# Patient Record
Sex: Male | Born: 1995 | Hispanic: Yes | Marital: Single | Smoking: Never smoker
Health system: Southern US, Community
[De-identification: ages and names within clinical notes are randomized; demographics above are authoritative.]

---
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
## 2014-05-09 NOTE — Op Note (Signed)
PATIENT NAME:  Gerald Reyes, Gerald Reyes MR#:  [id number] DATE OF BIRTH:  1995-07-25  DATE OF PROCEDURE:  01/04/2013  PREOPERATIVE DIAGNOSES: Preoperative diagnosis right knee anterior cruciate ligament tear, medial meniscus tear.   POSTOPERATIVE DIAGNOSIS: Preoperative diagnosis right knee anterior cruciate ligament tear, medial meniscus tear.   PROCEDURE PERFORMED:  1.  Arthroscopy, right knee.  2.  Arthroscopic-assisted anterior cruciate ligament reconstruction with hamstring autograft.  3.  Medial meniscus repair.   SURGEON: Murlean Hark, M.D.   ASSISTANT: Dedra Skeens.   ANESTHESIA: Femoral nerve block and general anesthesia.   ESTIMATED BLOOD LOSS: Minimal.   TOURNIQUET TIME: 23 minutes at 300 mmHg.   OPERATIVE FINDINGS: Complete tear, anterior cruciate ligament. Posterior horn, medial meniscus transverse tear with a some degeneration of torn portion.   IMPLANTS: Arthrex.  DRAINS: None.   COMPLICATIONS: None.   INDICATIONS FOR PROCEDURE: The patient is an 19 year old young man who had sustained an injury to the anterior cruciate ligament many years ago. After recurrent episodes of instability, most recently this past September, he decided to proceed with surgical intervention. Risks and benefits were explained to the patient and his father. Decision was made to use hamstring autograft.   DESCRIPTION OF PROCEDURE: The patient was identified in the preoperative holding area. Right lower extremity was marked as the operative site. The patient was brought into the Operating Room and placed on the table in supine position after femoral nerve block was administered in the preoperative holding area. General anesthesia was administered. Right lower extremity was prepared and draped in the usual sterile fashion. Surgical timeout was performed.   The leg was elevated for exsanguination, and the tourniquet was inflated to 300 mmHg. Tibial tubercle was palpated. A longitudinal incision was made  over the (Dictation Anomaly) <<MISSING TEXT>>. The semitendinosus and the gracilis tendons were identified. The sartorius was transected and tagged for future repair. After all adhesions were removed from both tendons, the semitendinosus was harvested. When cleaned of soft tissue and quadrupled, it measured approximately 10 mm. Graft was prepared on the back table under sterile conditions. The tourniquet was deflated at 21 minutes.   Lateral viewing portal was made. Arthroscope was inserted. Patellofemoral compartment was examined. Cartilage remains well preserved. Medial and lateral gutters were evaluated for loose bodies. None were located. Medial compartment was entered. The patient has a grade 1 to 2 chondral damage on the medial femoral condyle. Posterior horn of the meniscus demonstrated an unstable transverse tear. The torn portion was mildly friable. A meniscal rasp was inserted, and a bleeding edge was achieved on the meniscal rim. Two (Dictation Anomaly) <<MISSING TEXT>> curved needle delivery system tacks were inserted. Meniscal tear was found to be very stable.   Attention was turned to the lateral compartment. There was mild softening of the chondral surfaces without any distinctive lesions. Lateral meniscus was well preserved.   At this time, attention was turned to the notch. Notch was cleaned of residual anterior cruciate ligament stump. Lateral wall of the notch was carefully debrided of soft tissue. A very mild notchplasty was performed in order to allow for more space for implantation. Camera was moved to the medial portal, and the femoral retro drill guide was inserted. A lateral incision was made and sharp dissection was carried down to the tensor fascia lata. Blunt dissection was carried down to the lateral femoral cortex.   A 10 mm retro guide pin was inserted in appropriate position on the femoral lateral femoral notch. This was retrodrilled to approximately  32 mm, leaving a large  cortical a edge. Confirmation was made under direct visualization that no bony surfaces were breached. At this time, the FiberWire loop was passed through the femoral socket and into the joint. A tibial guide was now placed, in line with the anterior horn of the lateral meniscus and at the anterior edge of the posterior cruciate ligament. A 10 mm hole was drilled. Excess bone and soft tissue were debrided from the joint. The tibial tunnel was sequentially dilated to 10 mm. The FiberWire suture was passed through and the graft was slowly brought into the joint.   The tightrope button carefully flipped on the outer lateral cortex of the femur. Confirmation was done on fluoroscopy. The anterior cruciate ligament graft was hoisted into the joint, and care was taken to ensure that 25 mm of graft was nicely seated in the femoral socket.   Attention was now turned to the tibia. The tightrope 14 mm round button was attached and lowered down to the anterior tibial cortex. At least 20 mm of graft was nicely seated in the tibial tunnel. The graft was found to be nice and tight on probing. With the knee fully extended, there was no impingement of the graft on the notch. The femoral portion was terminally tightened, and tibial portion was terminally tightened.   At this time, secondary fixation was provided to the tibial side using a BioComposite SwiveLock. All wounds were copiously irrigated. The subcutaneous tissue was closed using 2-0 Vicryl suture. Skin was closed using nylon. Sterile dressings were applied. The patient was placed in a brace, locked in extension. He will follow up in my office in three days.      ____________________________ Murlean HarkShalini Tykwon Fera, MD sr:cg D: 01/04/2013 16:31:15 ET T: 01/05/2013 00:52:58 ET JOB#: [id number]  cc: Murlean HarkShalini Tahj Njoku, MD, <Dictator>

## 2014-05-10 NOTE — Op Note (Signed)
PATIENT NAME:  Gerald Reyes, Gerald Reyes MR#:  [id number] DATE OF BIRTH:  [birth year]  DATE OF PROCEDURE:  01/01/2013  PREOPERATIVE DIAGNOSES: Preoperative diagnosis right knee anterior cruciate ligament tear, medial meniscus tear.   POSTOPERATIVE DIAGNOSIS: Preoperative diagnosis right knee anterior cruciate ligament tear, medial meniscus tear.   PROCEDURE PERFORMED:  1.  Arthroscopy, right knee.  2.  Arthroscopic-assisted anterior cruciate ligament reconstruction with hamstring autograft.  3.  Medial meniscus repair.   SURGEON: Gerald Hark, M.D.   ASSISTANT: Dedra Skeens.   ANESTHESIA: Femoral nerve block and general anesthesia.   ESTIMATED BLOOD LOSS: Minimal.   TOURNIQUET TIME: 23 minutes at 300 mmHg.   OPERATIVE FINDINGS: Complete tear, anterior cruciate ligament. Posterior horn, medial meniscus transverse tear with a some degeneration of torn portion.   IMPLANTS: Arthrex.  DRAINS: None.   COMPLICATIONS: None.   INDICATIONS FOR PROCEDURE: The patient is an 19 year old young man who had sustained an injury to the anterior cruciate ligament many years ago. After recurrent episodes of instability, most recently this past September, he decided to proceed with surgical intervention. Risks and benefits were explained to the patient and his father. Decision was made to use hamstring autograft.   DESCRIPTION OF PROCEDURE: The patient was identified in the preoperative holding area. Right lower extremity was marked as the operative site. The patient was brought into the Operating Room and placed on the table in supine position after femoral nerve block was administered in the preoperative holding area. General anesthesia was administered. Right lower extremity was prepared and draped in the usual sterile fashion. Surgical timeout was performed.   The leg was elevated for exsanguination, and the tourniquet was inflated to 300 mmHg. Tibial tubercle was palpated. A longitudinal incision was made  over the antero-medial aspect of the proximal tibia. The semitendinosus and the gracilis tendons were identified. The sartorius was transected and tagged for future repair. After all adhesions were removed from both tendons, the semitendinosus was harvested. When cleaned of soft tissue and quadrupled, it measured approximately 10 mm. Graft was prepared on the back table under sterile conditions. The tourniquet was deflated at 21 minutes.   Lateral viewing portal was made. Arthroscope was inserted. Patellofemoral compartment was examined. Cartilage remains well preserved. Medial and lateral gutters were evaluated for loose bodies. None were located. Medial compartment was entered. The patient has a grade 1 to 2 chondral damage on the medial femoral condyle. Posterior horn of the meniscus demonstrated an unstable transverse tear. The torn portion was mildly friable. A meniscal rasp was inserted, and a bleeding edge was achieved on the meniscal rim. Two [employer] Fast Fix 360 curved needle delivery system tacks were inserted. Meniscal tear was found to be very stable.   Attention was turned to the lateral compartment. There was mild softening of the chondral surfaces without any distinctive lesions. Lateral meniscus was well preserved.   At this time, attention was turned to the notch. Notch was cleaned of residual anterior cruciate ligament stump. Lateral wall of the notch was carefully debrided of soft tissue. A very mild notchplasty was performed in order to allow for more space for implantation. Camera was moved to the medial portal, and the femoral retro drill guide was inserted. A lateral incision was made and sharp dissection was carried down to the tensor fascia lata. Blunt dissection was carried down to the lateral femoral cortex.   A 10 mm retro guide pin was inserted in appropriate position on the femoral lateral femoral notch. This  was retrodrilled to approximately 32 mm, leaving a large  cortical a edge. Confirmation was made under direct visualization that no bony surfaces were breached. At this time, the FiberWire loop was passed through the femoral socket and into the joint. A tibial guide was now placed, in line with the anterior horn of the lateral meniscus and at the anterior edge of the posterior cruciate ligament. A 10 mm hole was drilled. Excess bone and soft tissue were debrided from the joint. The tibial tunnel was sequentially dilated to 10 mm. The FiberWire suture was passed through and the graft was slowly brought into the joint.   The tightrope button carefully flipped on the outer lateral cortex of the femur. Confirmation was done on fluoroscopy. The anterior cruciate ligament graft was hoisted into the joint, and care was taken to ensure that 25 mm of graft was nicely seated in the femoral socket.   Attention was now turned to the tibia. The tightrope 14 mm round button was attached and lowered down to the anterior tibial cortex. At least 20 mm of graft was nicely seated in the tibial tunnel. The graft was found to be nice and tight on probing. With the knee fully extended, there was no impingement of the graft on the notch. The femoral portion was terminally tightened, and tibial portion was terminally tightened.   At this time, secondary fixation was provided to the tibial side using a BioComposite SwiveLock. All wounds were copiously irrigated. The subcutaneous tissue was closed using 2-0 Vicryl suture. Skin was closed using nylon. Sterile dressings were applied. The patient was placed in a brace, locked in extension. He will follow up in my office in three days.     ____________________________ Gerald HarkShalini Richard Ritchey, MD sr:cg D: 01/04/2013 16:31:00 ET T: 01/05/2013 00:52:58 ET JOB#: [id number]  cc: Gerald HarkShalini Milburn Freeney, MD, <Dictator> Gerald HarkSHALINI Kristopher Attwood MD ELECTRONICALLY SIGNED 02/07/2013 9:24

## 2017-01-11 ENCOUNTER — Emergency Department: Payer: Worker's Compensation

## 2017-01-11 ENCOUNTER — Encounter: Payer: Self-pay | Admitting: Physician Assistant

## 2017-01-11 ENCOUNTER — Emergency Department
Admission: EM | Admit: 2017-01-11 | Discharge: 2017-01-11 | Disposition: A | Discharge status: Home / Self Care | Payer: Worker's Compensation | Attending: Student in an Organized Health Care Education/Training Program | Admitting: Student in an Organized Health Care Education/Training Program

## 2017-01-11 ENCOUNTER — Other Ambulatory Visit: Payer: Self-pay

## 2017-01-11 DIAGNOSIS — S62616A Displaced fracture of proximal phalanx of right little finger, initial encounter for closed fracture: Secondary | ICD-10-CM | POA: Insufficient documentation

## 2017-01-11 DIAGNOSIS — Y9389 Activity, other specified: Secondary | ICD-10-CM | POA: Insufficient documentation

## 2017-01-11 DIAGNOSIS — S3991XA Unspecified injury of abdomen, initial encounter: Secondary | ICD-10-CM | POA: Diagnosis not present

## 2017-01-11 DIAGNOSIS — Y999 Unspecified external cause status: Secondary | ICD-10-CM | POA: Diagnosis not present

## 2017-01-11 DIAGNOSIS — Y92410 Unspecified street and highway as the place of occurrence of the external cause: Secondary | ICD-10-CM | POA: Diagnosis not present

## 2017-01-11 DIAGNOSIS — S6991XA Unspecified injury of right wrist, hand and finger(s), initial encounter: Secondary | ICD-10-CM | POA: Diagnosis present

## 2017-01-11 MED ORDER — IBUPROFEN 800 MG PO TABS: 800.0000 mg | ORAL_TABLET | Freq: Three times a day (TID) | ORAL | 0 refills | Status: DC | PRN

## 2017-01-11 MED ORDER — IOPAMIDOL (ISOVUE-370) INJECTION 76%
85.0000 mL | Freq: Once | INTRAVENOUS | Status: AC | PRN
  Administered 2017-01-11: 85 mL via INTRAVENOUS
  Filled 2017-01-11: qty 100

## 2017-01-11 NOTE — Discharge Instructions (Signed)
Follow up with Dr. Allena KatzPatel for your broken finger, call for an appointment, elevate and ice the hand, continue to wear the splint, if your abdominal pain is worsening please return to the emergency department, the CT showed that you have a contusion to the abdomen, this is not life-threatening, take the ibuprofen as needed for pain, you will hurt worse in the next few days this is normal, return to the emergency department if you have any other complaints
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
## 2020-03-05 ENCOUNTER — Ambulatory Visit: Payer: Managed Care, Other (non HMO) | Admitting: Adult Health

## 2020-03-05 ENCOUNTER — Other Ambulatory Visit: Payer: Self-pay

## 2020-03-05 ENCOUNTER — Encounter: Payer: Self-pay | Admitting: Adult Health

## 2020-03-05 VITALS — BP 120/73 | HR 63 | Temp 98.1°F | Resp 16 | Ht 71.0 in | Wt 273.6 lb

## 2020-03-05 DIAGNOSIS — Z1389 Encounter for screening for other disorder: Secondary | ICD-10-CM

## 2020-03-05 DIAGNOSIS — Z113 Encounter for screening for infections with a predominantly sexual mode of transmission: Secondary | ICD-10-CM | POA: Diagnosis not present

## 2020-03-05 DIAGNOSIS — Z0000 Encounter for general adult medical examination without abnormal findings: Secondary | ICD-10-CM | POA: Diagnosis not present

## 2020-03-05 DIAGNOSIS — Z6838 Body mass index (BMI) 38.0-38.9, adult: Secondary | ICD-10-CM | POA: Insufficient documentation

## 2020-03-05 DIAGNOSIS — S76312A Strain of muscle, fascia and tendon of the posterior muscle group at thigh level, left thigh, initial encounter: Secondary | ICD-10-CM | POA: Insufficient documentation

## 2020-03-05 DIAGNOSIS — M62838 Other muscle spasm: Secondary | ICD-10-CM | POA: Insufficient documentation

## 2020-03-05 DIAGNOSIS — E559 Vitamin D deficiency, unspecified: Secondary | ICD-10-CM | POA: Insufficient documentation

## 2020-03-05 LAB — POCT URINALYSIS DIPSTICK
Bilirubin, UA: NEGATIVE
Blood, UA: NEGATIVE
Glucose, UA: NEGATIVE
Ketones, UA: NEGATIVE
Leukocytes, UA: NEGATIVE
Nitrite, UA: NEGATIVE
Protein, UA: NEGATIVE
Spec Grav, UA: 1.01 (ref 1.010–1.025)
Urobilinogen, UA: 0.2 E.U./dL
pH, UA: 6.5 (ref 5.0–8.0)

## 2020-03-05 MED ORDER — IBUPROFEN 600 MG PO TABS: 600.0000 mg | ORAL_TABLET | Freq: Three times a day (TID) | ORAL | 0 refills | Status: DC | PRN

## 2020-03-05 MED ORDER — BACLOFEN 10 MG PO TABS: 10.0000 mg | ORAL_TABLET | Freq: Every evening | ORAL | 0 refills | Status: DC | PRN

## 2020-03-05 NOTE — Progress Notes (Signed)
New patient visit   Patient: Gerald Reyes   DOB: [birth year]   24 y.o. Male  MRN: [id number] Visit Date: 03/05/2020  Today's healthcare provider: Jairo Ben, FNP   Chief Complaint  Patient presents with  . New Patient (Initial Visit)   Subjective    Gerald Reyes is a 25 y.o. male who presents today as a new patient to establish care.  HPI  Patient presents in office to establish care he states that he feels well today and has no concerns to address. Patient reports that he follows a general diet, he is staying active by exercising weekly and states that his sleep patterns are fair sleeping on average 5 hrs a night.    He has some neck pain, after being on the computer all day. Muscles are tight after work. Has tried some stretching exercises which help some. He denies any  known injury to spine.   He plays soccer, left leg, he has pulled left hamstring, he says it has improved with stretching and he has been icing, this occurred around two months ago. deneis any increasing pain, or gait problems and feels he has been doing well reports symptoms as mild.   He reports he is drinking alcohol on occasion like twice a month socially, he has [employer]  He reports he feels down a few days with work, he has been working at home for two years. Denies any suicidal or homicidal thoughts or intents.   He feels well he says mentally, just working at home gets boring he has been home since the Covid pandemic.    Patient  denies any fever, body aches,chills, rash, chest pain, shortness of breath, nausea, vomiting, or diarrhea.  He has no other concerns at this time reported.   Denies dizziness, lightheadedness, pre syncopal or syncopal episodes.    History reviewed. No pertinent past medical history. Past Surgical History:  Procedure Laterality Date  . ANTERIOR CRUCIATE LIGAMENT REPAIR  12/2013  . MENISCUS REPAIR  12/2013   No family status information on file.   History  reviewed. No pertinent family history. Social History   Socioeconomic History  . Marital status: Single    Spouse name: Not on file  . Number of children: Not on file  . Years of education: Not on file  . Highest education level: Not on file  Occupational History  . Not on file  Tobacco Use  . Smoking status: Never Smoker  . Smokeless tobacco: Never Used  Substance and Sexual Activity  . Alcohol use: Not Currently  . Drug use: Never  . Sexual activity: Not on file  Other Topics Concern  . Not on file  Social History Narrative  . Not on file   Social Determinants of Health   Financial Resource Strain: Not on file  Food Insecurity: Not on file  Transportation Needs: Not on file  Physical Activity: Not on file  Stress: Not on file  Social Connections: Not on file   Outpatient Medications Prior to Visit  Medication Sig  . [DISCONTINUED] ibuprofen (ADVIL,MOTRIN) 800 MG tablet Take 1 tablet (800 mg total) by mouth every 8 (eight) hours as needed.   No facility-administered medications prior to visit.   No Known Allergies   There is no immunization history on file for this patient.  Health Maintenance  Topic Date Due  . Hepatitis C Screening  Never done  . COVID-19 Vaccine (1) Never done  . HIV Screening  Never done  .  INFLUENZA VACCINE  04/16/2020 (Originally 08/18/2019)  . TETANUS/TDAP  03/05/2021 (Originally 08/10/2013)    Patient Care Team: Charley Lafrance, Eula FriedMichelle S, FNP as PCP - General (Family Medicine)  Review of Systems  Constitutional: Negative.   HENT: Negative.   Respiratory: Negative.   Cardiovascular: Negative.   Gastrointestinal: Negative.   Genitourinary: Negative.   Musculoskeletal: Negative.   Skin: Negative.   Neurological: Negative.   Psychiatric/Behavioral: Negative.     Last CBC No results found for: WBC, HGB, HCT, MCV, MCH, RDW, PLT Last metabolic panel Lab Results  Component Value Date   GLUCOSE 83 10/12/2012   NA 139 10/12/2012   K  4.5 10/12/2012   CL 104 10/12/2012   CO2 32 (H) 10/12/2012   BUN 18 10/12/2012   CREATININE 0.97 10/12/2012   GFRNONAA >60 10/12/2012   GFRAA >60 10/12/2012   CALCIUM 9.7 10/12/2012   PROT 8.2 10/12/2012   ALBUMIN 4.5 10/12/2012   BILITOT 0.6 10/12/2012   ALKPHOS 94 (L) 10/12/2012   AST 20 10/12/2012   ALT 24 10/12/2012   ANIONGAP 3 (L) 10/12/2012   Last lipids Lab Results  Component Value Date   CHOL 159 10/12/2012   HDL 42 10/12/2012   LDLCALC 85 10/12/2012   TRIG 158 (H) 10/12/2012   Last hemoglobin A1c Lab Results  Component Value Date   HGBA1C 5.2 10/12/2012   Last thyroid functions Lab Results  Component Value Date   TSH 1.81 10/12/2012   Last vitamin D No results found for: 25OHVITD2, 25OHVITD3, VD25OH Last vitamin B12 and Folate No results found for: VITAMINB12, FOLATE    Objective    BP 120/73   Pulse 63   Temp 98.1 F (36.7 C) (Oral)   Resp 16   Ht 5\' 11"  (1.803 m)   Wt 273 lb 9.6 oz (124.1 kg)   SpO2 99%   BMI 38.16 kg/m  Physical Exam Vitals and nursing note reviewed.  Constitutional:      General: He is active. He is not in acute distress.    Appearance: Normal appearance. He is well-developed and well-nourished. He is not ill-appearing, toxic-appearing, sickly-appearing or diaphoretic.     Comments: Patient is alert and oriented and responsive to questions Engages in eye contact with provider. Speaks in full sentences without any pauses without any shortness of breath or distress.    HENT:     Head: Normocephalic and atraumatic.     Right Ear: Hearing, tympanic membrane, ear canal and external ear normal.     Left Ear: Hearing, tympanic membrane, ear canal and external ear normal.     Nose: Nose normal. No congestion or rhinorrhea.     Mouth/Throat:     Mouth: Oropharynx is clear and moist and mucous membranes are normal. Mucous membranes are moist.     Pharynx: Uvula midline. No oropharyngeal exudate or posterior oropharyngeal erythema.   Eyes:     General: Lids are normal. No scleral icterus.       Right eye: No discharge.        Left eye: No discharge.     Extraocular Movements: EOM normal.     Conjunctiva/sclera: Conjunctivae normal.     Pupils: Pupils are equal, round, and reactive to light.  Neck:     Thyroid: No thyromegaly.     Vascular: Normal carotid pulses. No carotid bruit, hepatojugular reflux or JVD.     Trachea: Trachea and phonation normal. No tracheal tenderness or tracheal deviation.     Meningeal: Brudzinski's  sign absent.  Cardiovascular:     Rate and Rhythm: Normal rate and regular rhythm.     Pulses: Normal pulses and intact distal pulses.     Heart sounds: Normal heart sounds, S1 normal and S2 normal. Heart sounds not distant. No murmur heard. No friction rub. No gallop.   Pulmonary:     Effort: Pulmonary effort is normal. No accessory muscle usage or respiratory distress.     Breath sounds: Normal breath sounds. No stridor. No wheezing, rhonchi or rales.  Chest:     Chest wall: No tenderness.  Abdominal:     General: Bowel sounds are normal. There is no distension. Aorta is normal.     Palpations: Abdomen is soft. There is no mass.     Tenderness: There is no abdominal tenderness. There is no right CVA tenderness, left CVA tenderness, guarding or rebound.     Hernia: No hernia is present.  Musculoskeletal:        General: No deformity or edema. Normal range of motion.     Right shoulder: Normal.     Left shoulder: Normal.     Cervical back: Full passive range of motion without pain, normal range of motion and neck supple. Spasms (see diagram ) and tenderness (see diagram ) present. No swelling, edema, deformity, erythema, signs of trauma, lacerations, rigidity, torticollis, bony tenderness or crepitus. No pain with movement. Normal range of motion.     Thoracic back: Normal.     Lumbar back: Normal.       Back:     Right hip: Normal.     Left hip: Normal.     Right upper leg: Normal.      Left upper leg: Normal.     Right knee: Normal.     Left knee: Normal.     Comments: Patient moves on and off of exam table and in room without difficulty. Gait is normal in hall and in room. Patient is oriented to person place time and situation. Patient answers questions appropriately and engages in conversation.   Lymphadenopathy:     Head:     Right side of head: No submental, submandibular, tonsillar, preauricular, posterior auricular or occipital adenopathy.     Left side of head: No submental, submandibular, tonsillar, preauricular, posterior auricular or occipital adenopathy.     Cervical: No cervical adenopathy.     Upper Body:  No axillary adenopathy present. Skin:    General: Skin is warm, dry and intact.     Capillary Refill: Capillary refill takes less than 2 seconds.     Coloration: Skin is not pale.     Findings: No erythema or rash.     Nails: There is no clubbing or cyanosis.  Neurological:     Mental Status: He is alert and oriented to person, place, and time.     GCS: GCS eye subscore is 4. GCS verbal subscore is 5. GCS motor subscore is 6.     Cranial Nerves: No cranial nerve deficit.     Sensory: No sensory deficit.     Motor: No weakness or abnormal muscle tone.     Coordination: He displays a negative Romberg sign. Coordination normal.     Gait: Gait normal.     Deep Tendon Reflexes: Strength normal and reflexes are normal and symmetric. Reflexes normal.  Psychiatric:        Mood and Affect: Mood and affect and mood normal.        Speech: Speech  normal.        Behavior: Behavior normal.        Thought Content: Thought content normal.        Cognition and Memory: Cognition and memory normal.        Judgment: Judgment normal.     Depression Screen PHQ 2/9 Scores 03/05/2020  PHQ - 2 Score 2  PHQ- 9 Score 5   Results for orders placed or performed in visit on 03/05/20  POCT urinalysis dipstick  Result Value Ref Range   Color, UA yellow    Clarity, UA  clear    Glucose, UA Negative Negative   Bilirubin, UA negative    Ketones, UA negative    Spec Grav, UA 1.010 1.010 - 1.025   Blood, UA negative    pH, UA 6.5 5.0 - 8.0   Protein, UA Negative Negative   Urobilinogen, UA 0.2 0.2 or 1.0 E.U./dL   Nitrite, UA negative    Leukocytes, UA Negative Negative   Appearance     Odor      Assessment & Plan      1. Routine health maintenance Orders Placed This Encounter  Procedures  . CBC with Differential/Platelet  . Comprehensive metabolic panel  . Lipid panel  . TSH  . VITAMIN D 25 Hydroxy (Vit-D Deficiency, Fractures)  . HIV Antibody (routine testing w rflx)  . Hepatitis C Antibody  . RPR  . POCT urinalysis dipstick   The patient is advised to begin progressive daily aerobic exercise program, follow a low fat, low cholesterol diet, attempt to lose weight, decrease or avoid alcohol intake and reduce salt in diet and cooking.  Dietary lifestyle changes discussed.    2. Screening for blood or protein in urine  - POCT urinalysis dipstick  3. Vitamin D insufficiency  - VITAMIN D 25 Hydroxy (Vit-D Deficiency, Fractures)  4. Screening for STD (sexually transmitted disease) Declined any other STD testing. Denies any symptoms or known exposures.  - HIV Antibody (routine testing w rflx) - Hepatitis C Antibody - RPR  5. Muscle spasms of neck  - CBC with Differential/Platelet - Comprehensive metabolic panel - ibuprofen (ADVIL) 600 MG tablet; Take 1 tablet (600 mg total) by mouth every 8 (eight) hours as needed for moderate pain.  Dispense: 30 tablet; Refill: 0 - baclofen (LIORESAL) 10 MG tablet; Take 1 tablet (10 mg total) by mouth at bedtime as needed for muscle spasms.  Dispense: 30 each; Refill: 0  6. Body mass index (BMI) of 38.0-38.9 in adult See # 1 diagnosis for plan of care/ weight loss advised.   7. History of left hamstring strain  Was playing soccer, seems to be healing well, symptoms have improved per patient.   Stretches given in After visit summary. Follow up at anytime if worsening. Exam is normal.    Red Flags discussed. The patient was given clear instructions to go to ER or return to medical center if any red flags develop, symptoms do not improve, worsen or new problems develop. They verbalized understanding.  Return in about 3 months (around 06/02/2020), or if symptoms worsen or fail to improve, for at any time for any worsening symptoms, Go to Emergency room/ urgent care if worse.  The entirety of the information documented in the History of Present Illness, Review of Systems and Physical Exam were personally obtained by me. Portions of this information were initially documented by the CMA and reviewed by me for thoroughness and accuracy.    Beverely Pace  Azarel Banner, [place] [phone] (phone) [phone] (fax)  [facility]

## 2020-03-05 NOTE — Progress Notes (Signed)
Urine within normal limits.

## 2020-03-05 NOTE — Patient Instructions (Addendum)
Cervical Strain and Sprain Rehab Ask your health care provider which exercises are safe for you. Do exercises exactly as told by your health care provider and adjust them as directed. It is normal to feel mild stretching, pulling, tightness, or discomfort as you do these exercises. Stop right away if you feel sudden pain or your pain gets worse. Do not begin these exercises until told by your health care provider. Stretching and range-of-motion exercises Cervical side bending 1. Using good posture, sit on a stable chair or stand up. 2. Without moving your shoulders, slowly tilt your left / right ear to your shoulder until you feel a stretch in the opposite side neck muscles. You should be looking straight ahead. 3. Hold for __________ seconds. 4. Repeat with the other side of your neck. Repeat __________ times. Complete this exercise __________ times a day.   Cervical rotation 1. Using good posture, sit on a stable chair or stand up. 2. Slowly turn your head to the side as if you are looking over your left / right shoulder. ? Keep your eyes level with the ground. ? Stop when you feel a stretch along the side and the back of your neck. 3. Hold for __________ seconds. 4. Repeat this by turning to your other side. Repeat __________ times. Complete this exercise __________ times a day.   Thoracic extension and pectoral stretch 1. Roll a towel or a small blanket so it is about 4 inches (10 cm) in diameter. 2. Lie down on your back on a firm surface. 3. Put the towel lengthwise, under your spine in the middle of your back. It should not be under your shoulder blades. The towel should line up with your spine from your middle back to your lower back. 4. Put your hands behind your head and let your elbows fall out to your sides. 5. Hold for __________ seconds. Repeat __________ times. Complete this exercise __________ times a day. Strengthening exercises Isometric upper cervical  flexion 1. Lie on your back with a thin pillow behind your head and a small rolled-up towel under your neck. 2. Gently tuck your chin toward your chest and nod your head down to look toward your feet. Do not lift your head off the pillow. 3. Hold for __________ seconds. 4. Release the tension slowly. Relax your neck muscles completely before you repeat this exercise. Repeat __________ times. Complete this exercise __________ times a day. Isometric cervical extension 1. Stand about 6 inches (15 cm) away from a wall, with your back facing the wall. 2. Place a soft object, about 6-8 inches (15-20 cm) in diameter, between the back of your head and the wall. A soft object could be a small pillow, a ball, or a folded towel. 3. Gently tilt your head back and press into the soft object. Keep your jaw and forehead relaxed. 4. Hold for __________ seconds. 5. Release the tension slowly. Relax your neck muscles completely before you repeat this exercise. Repeat __________ times. Complete this exercise __________ times a day.   Posture and body mechanics Body mechanics refers to the movements and positions of your body while you do your daily activities. Posture is part of body mechanics. Good posture and healthy body mechanics can help to relieve stress in your body's tissues and joints. Good posture means that your spine is in its natural S-curve position (your spine is neutral), your shoulders are pulled back slightly, and your head is not tipped forward. The  following are general guidelines for applying improved posture and body mechanics to your everyday activities. Sitting 1. When sitting, keep your spine neutral and keep your feet flat on the floor. Use a footrest, if necessary, and keep your thighs parallel to the floor. Avoid rounding your shoulders, and avoid tilting your head forward. 2. When working at a desk or a computer, keep your desk at a height where your hands are slightly lower than your  elbows. Slide your chair under your desk so you are close enough to maintain good posture. 3. When working at a computer, place your monitor at a height where you are looking straight ahead and you do not have to tilt your head forward or downward to look at the screen.   Standing  When standing, keep your spine neutral and keep your feet about hip-width apart. Keep a slight bend in your knees. Your ears, shoulders, and hips should line up.  When you do a task in which you stand in one place for a long time, place one foot up on a stable object that is 2-4 inches (5-10 cm) high, such as a footstool. This helps keep your spine neutral.   Resting When lying down and resting, avoid positions that are most painful for you. Try to support your neck in a neutral position. You can use a contour pillow or a small rolled-up towel. Your pillow should support your neck but not push on it. This information is not intended to replace advice given to you by your health care provider. Make sure you discuss any questions you have with your health care provider. Document Revised: 04/25/2018 Document Reviewed: 10/04/2017 Elsevier Patient Education  2021 [employer]  Muscle Cramps and Spasms Muscle cramps and spasms are when muscles tighten by themselves. They usually get better within minutes. Muscle cramps are painful. They are usually stronger and last longer than muscle spasms. Muscle spasms may or may not be painful. They can last a few seconds or much longer. Cramps and spasms can affect any muscle, but they occur most often in the calf muscles of the leg. They are usually not caused by a serious problem. In many cases, the cause is not known. Some common causes include:  Doing more physical work or exercise than your body is ready for.  Using the muscles too much (overuse) by repeating certain movements too many times.  Staying in a certain position for a long time.  Playing a sport or doing an  activity without preparing properly.  Using bad form or technique while playing a sport or doing an activity.  Not having enough water in your body (dehydration).  Injury.  Side effects of some medicines.  Low levels of the salts and minerals in your blood (electrolytes), such as low potassium or calcium. Follow these instructions at home: Managing pain and stiffness  Massage, stretch, and relax the muscle. Do this for many minutes at a time.  If told, put heat on tight or tense muscles as often as told by your doctor. Use the heat source that your doctor recommends, such as a moist heat pack or a heating pad. ? Place a towel between your skin and the heat source. ? Leave the heat on for 20-30 minutes. ? Remove the heat if your skin turns bright red. This is very important if you are not able to feel pain, heat, or cold. You may have a greater risk of getting burned.  If told, put ice on  the affected area. This may help if you are sore or have pain after a cramp or spasm. ? Put ice in a plastic bag. ? Place a towel between your skin and the bag. ? Leave the ice on for 20 minutes, 2-3 times a day.  Try taking hot showers or baths to help relax tight muscles.      Eating and drinking  Drink enough fluid to keep your pee (urine) pale yellow.  Eat a healthy diet to help ensure that your muscles work well. This should include: ? Fruits and vegetables. ? Lean protein. ? Whole grains. ? Low-fat or nonfat dairy products. General instructions  If you are having cramps often, avoid intense exercise for several days.  Take over-the-counter and prescription medicines only as told by your doctor.  Watch for any changes in your symptoms.  Keep all follow-up visits as told by your doctor. This is important. Contact a doctor if:  Your cramps or spasms get worse or happen more often.  Your cramps or spasms do not get better with time. Summary  Muscle cramps and spasms are when  muscles tighten by themselves. They usually get better within minutes.  Cramps and spasms occur most often in the calf muscles of the leg.  Massage, stretch, and relax the muscle. This may help the cramp or spasm go away.  Drink enough fluid to keep your pee (urine) pale yellow. This information is not intended to replace advice given to you by your health care provider. Make sure you discuss any questions you have with your health care provider. Document Revised: 05/29/2017 Document Reviewed: 05/29/2017 Elsevier Patient Education  2021 [employer]  Hamstring Strain  A hamstring strain happens when the muscles in the back of the thighs (hamstring muscles) are overstretched or torn. The hamstring muscles are used in straightening the hips, bending the knees, and pulling back the legs. This injury is often called a pulled hamstring muscle. The tissue that connects the muscle to a bone (tendon) may also be affected. The severity of a hamstring strain may be rated in degrees or grades. First-degree (or grade 1) strains have the least amount of muscle tearing and pain. Second-degree and third-degree (grade 2 and 3) strains have increasingly more tearing and pain. What are the causes? This condition is caused by a sudden, violent force being placed on the hamstring muscles, stretching them too far. This often happens during activities that involve running, jumping, kicking, or weight lifting. What increases the risk? Hamstring strains are especially common in athletes. The following factors may also make you more likely to develop this condition:  Having low strength, endurance, or flexibility of the hamstring muscles.  Doing high-impact physical activity or sports.  Having poor physical fitness.  Having a previous leg injury.  Having tired (fatigued) muscles. What are the signs or symptoms? Symptoms of this condition include:  Pain in the back of the  thigh.  Swelling.  Bruising.  Muscle spasms.  Trouble moving the affected muscle because of pain. For severe strains, you may feel popping or snapping in the back of your thigh when the injury occurs. How is this diagnosed? This condition is diagnosed based on your symptoms, your medical history, and a physical exam. How is this treated? Treatment for this condition usually involves:  Protecting, resting, icing, applying compression, and elevating the injured area (PRICE therapy).  Medicines. Your health care provider may recommend medicines to help reduce pain or inflammation.  Doing exercises to  regain strength and flexibility in the muscles. Your health care provider will tell you when it is okay to begin exercising. Follow these instructions at home: PRICE therapy Use PRICE therapy to promote muscle healing during the first 2-3 days after your injury, or as told by your health care provider.  Protect the muscle from being injured again.  Rest your injury. This usually involves limiting your normal activities and not using the injured hamstring muscle. Talk with your health care provider about how you should limit your activities.  Apply ice to the injured area: ? Put ice in a plastic bag. ? Place a towel between your skin and the bag. ? Leave the ice on for 20 minutes, 2-3 times a day. After the third day, switch to applying heat as told.  Put pressure (compression) on your injured hamstring by wrapping it with an elastic bandage. Be careful not to wrap it too tightly. That may interfere with blood circulation or may increase swelling.  Raise (elevate) your injured hamstring above the level of your heart as often as possible. When you are lying down, you can do this by putting a pillow under your thigh.   Activity  Begin exercising or stretching only as told by your health care provider.  Do not return to full activity level until your health care provider approves.  To  help prevent muscle strains in the future, always warm up before exercising and stretch afterward. General instructions  Take over-the-counter and prescription medicines only as told by your health care provider.  If directed, apply heat to the affected area as often as told by your health care provider. Use the heat source that your health care provider recommends, such as a moist heat pack or a heating pad. ? Place a towel between your skin and the heat source. ? Leave the heat on for 20-30 minutes. ? Remove the heat if your skin turns bright red. This is especially important if you are unable to feel pain, heat, or cold. You may have a greater risk of getting burned.  Keep all follow-up visits as told by your health care provider. This is important. Contact a health care provider if you have:  Increasing pain or swelling in the injured area.  Numbness, tingling, or a significant loss of strength in the injured area. Get help right away if:  Your foot or your toes become cold or turn blue. Summary  A hamstring strain happens when the muscles in the back of the thighs (hamstring muscles) are overstretched or torn.  This injury can be caused by a sudden, violent force being placed on the hamstring muscles, causing them to stretch too far.  Symptoms include pain, swelling, and muscle spasms in the injured area.  Treatment includes what is called PRICE therapy: protecting, resting, icing, applying compression, and elevating the injured area. This information is not intended to replace advice given to you by your health care provider. Make sure you discuss any questions you have with your health care provider. Document Revised: 09/06/2019 Document Reviewed: 09/06/2019 Elsevier Patient Education  2021 [employer] Inc[employer] Health Maintenance, Male Adopting a healthy lifestyle and getting preventive care are important in promoting health and wellness. Ask your health care provider about:  The  right schedule for you to have regular tests and exams.  Things you can do on your own to prevent diseases and keep yourself healthy. What should I know about diet, weight, and exercise? Eat a healthy diet  Eat a  diet that includes plenty of vegetables, fruits, low-fat dairy products, and lean protein.  Do not eat a lot of foods that are high in solid fats, added sugars, or sodium.   Maintain a healthy weight Body mass index (BMI) is a measurement that can be used to identify possible weight problems. It estimates body fat based on height and weight. Your health care provider can help determine your BMI and help you achieve or maintain a healthy weight. Get regular exercise Get regular exercise. This is one of the most important things you can do for your health. Most adults should:  Exercise for at least 150 minutes each week. The exercise should increase your heart rate and make you sweat (moderate-intensity exercise).  Do strengthening exercises at least twice a week. This is in addition to the moderate-intensity exercise.  Spend less time sitting. Even light physical activity can be beneficial. Watch cholesterol and blood lipids Have your blood tested for lipids and cholesterol at 26 years of age, then have this test every 5 years. You may need to have your cholesterol levels checked more often if:  Your lipid or cholesterol levels are high.  You are older than 26 years of age.  You are at high risk for heart disease. What should I know about cancer screening? Many types of cancers can be detected early and may often be prevented. Depending on your health history and family history, you may need to have cancer screening at various ages. This may include screening for:  Colorectal cancer.  Prostate cancer.  Skin cancer.  Lung cancer. What should I know about heart disease, diabetes, and high blood pressure? Blood pressure and heart disease  High blood pressure causes heart  disease and increases the risk of stroke. This is more likely to develop in people who have high blood pressure readings, are of African descent, or are overweight.  Talk with your health care provider about your target blood pressure readings.  Have your blood pressure checked: ? Every 3-5 years if you are 79-94 years of age. ? Every year if you are 76 years old or older.  If you are between the ages of 34 and 68 and are a current or former smoker, ask your health care provider if you should have a one-time screening for abdominal aortic aneurysm (AAA). Diabetes Have regular diabetes screenings. This checks your fasting blood sugar level. Have the screening done:  Once every three years after age 23 if you are at a normal weight and have a low risk for diabetes.  More often and at a younger age if you are overweight or have a high risk for diabetes. What should I know about preventing infection? Hepatitis B If you have a higher risk for hepatitis B, you should be screened for this virus. Talk with your health care provider to find out if you are at risk for hepatitis B infection. Hepatitis C Blood testing is recommended for:  Everyone born from [birth year] through 1966.  Anyone with known risk factors for hepatitis C. Sexually transmitted infections (STIs)  You should be screened each year for STIs, including gonorrhea and chlamydia, if: ? You are sexually active and are younger than 26 years of age. ? You are older than 26 years of age and your health care provider tells you that you are at risk for this type of infection. ? Your sexual activity has changed since you were last screened, and you are at increased risk for chlamydia or  gonorrhea. Ask your health care provider if you are at risk.  Ask your health care provider about whether you are at high risk for HIV. Your health care provider may recommend a prescription medicine to help prevent HIV infection. If you choose to take medicine  to prevent HIV, you should first get tested for HIV. You should then be tested every 3 months for as long as you are taking the medicine. Follow these instructions at home: Lifestyle  Do not use any products that contain nicotine or tobacco, such as cigarettes, e-cigarettes, and chewing tobacco. If you need help quitting, ask your health care provider.  Do not use street drugs.  Do not share needles.  Ask your health care provider for help if you need support or information about quitting drugs. Alcohol use  Do not drink alcohol if your health care provider tells you not to drink.  If you drink alcohol: ? Limit how much you have to 0-2 drinks a day. ? Be aware of how much alcohol is in your drink. In the [place] one drink equals one 12 oz bottle of beer (355 mL), one 5 oz glass of wine (148 mL), or one 1 oz glass of hard liquor (44 mL). General instructions  Schedule regular health, dental, and eye exams.  Stay current with your vaccines.  Tell your health care provider if: ? You often feel depressed. ? You have ever been abused or do not feel safe at home. Summary  Adopting a healthy lifestyle and getting preventive care are important in promoting health and wellness.  Follow your health care provider's instructions about healthy diet, exercising, and getting tested or screened for diseases.  Follow your health care provider's instructions on monitoring your cholesterol and blood pressure. This information is not intended to replace advice given to you by your health care provider. Make sure you discuss any questions you have with your health care provider. Document Revised: 12/27/2017 Document Reviewed: 12/27/2017 Elsevier Patient Education  2021 [employer]   Calorie Counting for [employer] Calories are units of energy. Your body needs a certain number of calories from food to keep going throughout the day. When you eat or drink more calories than your body needs, your  body stores the extra calories mostly as fat. When you eat or drink fewer calories than your body needs, your body burns fat to get the energy it needs. Calorie counting means keeping track of how many calories you eat and drink each day. Calorie counting can be helpful if you need to lose weight. If you eat fewer calories than your body needs, you should lose weight. Ask your health care provider what a healthy weight is for you. For calorie counting to work, you will need to eat the right number of calories each day to lose a healthy amount of weight per week. A dietitian can help you figure out how many calories you need in a day and will suggest ways to reach your calorie goal.  A healthy amount of weight to lose each week is usually 1-2 lb (0.5-0.9 kg). This usually means that your daily calorie intake should be reduced by 500-750 calories.  Eating 1,200-1,500 calories a day can help most women lose weight.  Eating 1,500-1,800 calories a day can help most men lose weight. What do I need to know about calorie counting? Work with your health care provider or dietitian to determine how many calories you should get each day. To meet your daily  calorie goal, you will need to:  Find out how many calories are in each food that you would like to eat. Try to do this before you eat.  Decide how much of the food you plan to eat.  Keep a food log. Do this by writing down what you ate and how many calories it had. To successfully lose weight, it is important to balance calorie counting with a healthy lifestyle that includes regular activity. Where do I find calorie information? The number of calories in a food can be found on a Nutrition Facts label. If a food does not have a Nutrition Facts label, try to look up the calories online or ask your dietitian for help. Remember that calories are listed per serving. If you choose to have more than one serving of a food, you will have to multiply the calories  per serving by the number of servings you plan to eat. For example, the label on a package of bread might say that a serving size is 1 slice and that there are 90 calories in a serving. If you eat 1 slice, you will have eaten 90 calories. If you eat 2 slices, you will have eaten 180 calories.   How do I keep a food log? After each time that you eat, record the following in your food log as soon as possible:  What you ate. Be sure to include toppings, sauces, and other extras on the food.  How much you ate. This can be measured in cups, ounces, or number of items.  How many calories were in each food and drink.  The total number of calories in the food you ate. Keep your food log near you, such as in a pocket-sized notebook or on an app or website on your mobile phone. Some programs will calculate calories for you and show you how many calories you have left to meet your daily goal. What are some portion-control tips?  Know how many calories are in a serving. This will help you know how many servings you can have of a certain food.  Use a measuring cup to measure serving sizes. You could also try weighing out portions on a kitchen scale. With time, you will be able to estimate serving sizes for some foods.  Take time to put servings of different foods on your favorite plates or in your favorite bowls and cups so you know what a serving looks like.  Try not to eat straight from a food's packaging, such as from a bag or box. Eating straight from the package makes it hard to see how much you are eating and can lead to overeating. Put the amount you would like to eat in a cup or on a plate to make sure you are eating the right portion.  Use smaller plates, glasses, and bowls for smaller portions and to prevent overeating.  Try not to multitask. For example, avoid watching TV or using your computer while eating. If it is time to eat, sit down at a table and enjoy your food. This will help you  recognize when you are full. It will also help you be more mindful of what and how much you are eating. What are tips for following this plan? Reading food labels  Check the calorie count compared with the serving size. The serving size may be smaller than what you are used to eating.  Check the source of the calories. Try to choose foods that are high  in protein, fiber, and vitamins, and low in saturated fat, trans fat, and sodium. Shopping  Read nutrition labels while you shop. This will help you make healthy decisions about which foods to buy.  Pay attention to nutrition labels for low-fat or fat-free foods. These foods sometimes have the same number of calories or more calories than the full-fat versions. They also often have added sugar, starch, or salt to make up for flavor that was removed with the fat.  Make a grocery list of lower-calorie foods and stick to it. Cooking  Try to cook your favorite foods in a healthier way. For example, try baking instead of frying.  Use low-fat dairy products. Meal planning  Use more fruits and vegetables. One-half of your plate should be fruits and vegetables.  Include lean proteins, such as chicken, [place], and fish. Lifestyle Each week, aim to do one of the following:  150 minutes of moderate exercise, such as walking.  75 minutes of vigorous exercise, such as running. General information  Know how many calories are in the foods you eat most often. This will help you calculate calorie counts faster.  Find a way of tracking calories that works for you. Get creative. Try different apps or programs if writing down calories does not work for you. What foods should I eat?  Eat nutritious foods. It is better to have a nutritious, high-calorie food, such as an avocado, than a food with few nutrients, such as a bag of potato chips.  Use your calories on foods and drinks that will fill you up and will not leave you hungry soon after  eating. ? Examples of foods that fill you up are nuts and nut butters, vegetables, lean proteins, and high-fiber foods such as whole grains. High-fiber foods are foods with more than 5 g of fiber per serving.  Pay attention to calories in drinks. Low-calorie drinks include water and unsweetened drinks. The items listed above may not be a complete list of foods and beverages you can eat. Contact a dietitian for more information.   What foods should I limit? Limit foods or drinks that are not good sources of vitamins, minerals, or protein or that are high in unhealthy fats. These include:  Candy.  Other sweets.  Sodas, specialty coffee drinks, alcohol, and juice. The items listed above may not be a complete list of foods and beverages you should avoid. Contact a dietitian for more information. How do I count calories when eating out?  Pay attention to portions. Often, portions are much larger when eating out. Try these tips to keep portions smaller: ? Consider sharing a meal instead of getting your own. ? If you get your own meal, eat only half of it. Before you start eating, ask for a container and put half of your meal into it. ? When available, consider ordering smaller portions from the menu instead of full portions.  Pay attention to your food and drink choices. Knowing the way food is cooked and what is included with the meal can help you eat fewer calories. ? If calories are listed on the menu, choose the lower-calorie options. ? Choose dishes that include vegetables, fruits, whole grains, low-fat dairy products, and lean proteins. ? Choose items that are boiled, broiled, grilled, or steamed. Avoid items that are buttered, battered, fried, or served with cream sauce. Items labeled as crispy are usually fried, unless stated otherwise. ? Choose water, low-fat milk, unsweetened iced tea, or other drinks without added sugar.  If you want an alcoholic beverage, choose a lower-calorie option,  such as a glass of wine or light beer. ? Ask for dressings, sauces, and syrups on the side. These are usually high in calories, so you should limit the amount you eat. ? If you want a salad, choose a garden salad and ask for grilled meats. Avoid extra toppings such as bacon, cheese, or fried items. Ask for the dressing on the side, or ask for olive oil and vinegar or lemon to use as dressing.  Estimate how many servings of a food you are given. Knowing serving sizes will help you be aware of how much food you are eating at restaurants. Where to find more information  Centers for Disease Control and Prevention: FootballExhibition.com.br  [place]. Department of Agriculture: WrestlingReporter.dk Summary  Calorie counting means keeping track of how many calories you eat and drink each day. If you eat fewer calories than your body needs, you should lose weight.  A healthy amount of weight to lose per week is usually 1-2 lb (0.5-0.9 kg). This usually means reducing your daily calorie intake by 500-750 calories.  The number of calories in a food can be found on a Nutrition Facts label. If a food does not have a Nutrition Facts label, try to look up the calories online or ask your dietitian for help.  Use smaller plates, glasses, and bowls for smaller portions and to prevent overeating.  Use your calories on foods and drinks that will fill you up and not leave you hungry shortly after a meal. This information is not intended to replace advice given to you by your health care provider. Make sure you discuss any questions you have with your health care provider. Document Revised: 02/14/2019 Document Reviewed: 02/14/2019 Elsevier Patient Education  2021 Elsevier Inc.   Fat and Cholesterol Restricted Eating Plan Getting too much fat and cholesterol in your diet may cause health problems. Choosing the right foods helps keep your fat and cholesterol at normal levels. This can keep you from getting certain diseases. Your doctor  may recommend an eating plan that includes:  Total fat: ______% or less of total calories a day.  Saturated fat: ______% or less of total calories a day.  Cholesterol: less than _________mg a day.  Fiber: ______g a day. What are tips for following this plan? Meal planning  At meals, divide your plate into four equal parts: ? Fill one-half of your plate with vegetables and green salads. ? Fill one-fourth of your plate with whole grains. ? Fill one-fourth of your plate with low-fat (lean) protein foods.  Eat fish that is high in omega-3 fats at least two times a week. This includes mackerel, tuna, sardines, and salmon.  Eat foods that are high in fiber, such as whole grains, beans, apples, broccoli, carrots, peas, and barley. General tips  Work with your doctor to lose weight if you need to.  Avoid: ? Foods with added sugar. ? Fried foods. ? Foods with partially hydrogenated oils.  Limit alcohol intake to no more than 1 drink a day for nonpregnant women and 2 drinks a day for men. One drink equals 12 oz of beer, 5 oz of wine, or 1 oz of hard liquor.   Reading food labels  Check food labels for: ? Trans fats. ? Partially hydrogenated oils. ? Saturated fat (g) in each serving. ? Cholesterol (mg) in each serving. ? Fiber (g) in each serving.  Choose foods with healthy fats, such as: ?  Monounsaturated fats. ? Polyunsaturated fats. ? Omega-3 fats.  Choose grain products that have whole grains. Look for the word "whole" as the first word in the ingredient list. Cooking  Cook foods using low-fat methods. These include baking, boiling, grilling, and broiling.  Eat more home-cooked foods. Eat at restaurants and buffets less often.  Avoid cooking using saturated fats, such as butter, cream, palm oil, palm kernel oil, and coconut oil. Recommended foods Fruits  All fresh, canned (in natural juice), or frozen fruits. Vegetables  Fresh or frozen vegetables (raw, steamed,  roasted, or grilled). Green salads. Grains  Whole grains, such as whole wheat or whole grain breads, crackers, cereals, and pasta. Unsweetened oatmeal, bulgur, barley, quinoa, or brown rice. Corn or whole wheat flour tortillas. Meats and other protein foods  Ground beef (85% or leaner), grass-fed beef, or beef trimmed of fat. Skinless chicken or [place]. Ground chicken or [place]. Pork trimmed of fat. All fish and seafood. Egg whites. Dried beans, peas, or lentils. Unsalted nuts or seeds. Unsalted canned beans. Nut butters without added sugar or oil. Dairy  Low-fat or nonfat dairy products, such as skim or 1% milk, 2% or reduced-fat cheeses, low-fat and fat-free ricotta or cottage cheese, or plain low-fat and nonfat yogurt. Fats and oils  Tub margarine without trans fats. Light or reduced-fat mayonnaise and salad dressings. Avocado. Olive, canola, sesame, or safflower oils. The items listed above may not be a complete list of foods and beverages you can eat. Contact a dietitian for more information.   Foods to avoid Fruits  Canned fruit in heavy syrup. Fruit in cream or butter sauce. Fried fruit. Vegetables  Vegetables cooked in cheese, cream, or butter sauce. Fried vegetables. Grains  White bread. White pasta. White rice. Cornbread. Bagels, pastries, and croissants. Crackers and snack foods that contain trans fat and hydrogenated oils. Meats and other protein foods  Fatty cuts of meat. Ribs, chicken wings, bacon, sausage, bologna, salami, chitterlings, fatback, hot dogs, bratwurst, and packaged lunch meats. Liver and organ meats. Whole eggs and egg yolks. Chicken and [place] with skin. Fried meat. Dairy  Whole or 2% milk, cream, half-and-half, and cream cheese. Whole milk cheeses. Whole-fat or sweetened yogurt. Full-fat cheeses. Nondairy creamers and whipped toppings. Processed cheese, cheese spreads, and cheese curds. Beverages  Alcohol. Sugar-sweetened drinks such as sodas, lemonade,  and fruit drinks. Fats and oils  Butter, stick margarine, lard, shortening, ghee, or bacon fat. Coconut, palm kernel, and palm oils. Sweets and desserts  Corn syrup, sugars, honey, and molasses. Candy. Jam and jelly. Syrup. Sweetened cereals. Cookies, pies, cakes, donuts, muffins, and ice cream. The items listed above may not be a complete list of foods and beverages you should avoid. Contact a dietitian for more information. Summary  Choosing the right foods helps keep your fat and cholesterol at normal levels. This can keep you from getting certain diseases.  At meals, fill one-half of your plate with vegetables and green salads.  Eat high-fiber foods, like whole grains, beans, apples, carrots, peas, and barley.  Limit added sugar, saturated fats, alcohol, and fried foods. This information is not intended to replace advice given to you by your health care provider. Make sure you discuss any questions you have with your health care provider. Document Revised: 05/08/2019 Document Reviewed: 05/08/2019 Elsevier Patient Education  2021 [employer].

## 2020-03-06 ENCOUNTER — Other Ambulatory Visit: Payer: Self-pay | Admitting: Adult Health

## 2020-03-06 DIAGNOSIS — E559 Vitamin D deficiency, unspecified: Secondary | ICD-10-CM

## 2020-03-06 LAB — LIPID PANEL
Chol/HDL Ratio: 6.1 ratio — ABNORMAL HIGH (ref 0.0–5.0)
Cholesterol, Total: 202 mg/dL — ABNORMAL HIGH (ref 100–199)
HDL: 33 mg/dL — ABNORMAL LOW (ref >39)
LDL Chol Calc (NIH): 119 mg/dL — ABNORMAL HIGH (ref 0–99)
Triglycerides: 284 mg/dL — ABNORMAL HIGH (ref 0–149)
VLDL Cholesterol Cal: 50 mg/dL — ABNORMAL HIGH (ref 5–40)

## 2020-03-06 LAB — TSH: TSH: 3.76 u[IU]/mL (ref 0.450–4.500)

## 2020-03-06 LAB — COMPREHENSIVE METABOLIC PANEL
ALT: 35 IU/L (ref 0–44)
AST: 23 IU/L (ref 0–40)
Albumin/Globulin Ratio: 1.9 (ref 1.2–2.2)
Albumin: 4.8 g/dL (ref 4.1–5.2)
Alkaline Phosphatase: 69 IU/L (ref 44–121)
BUN/Creatinine Ratio: 14 (ref 9–20)
BUN: 15 mg/dL (ref 6–20)
Bilirubin Total: 0.7 mg/dL (ref 0.0–1.2)
CO2: 23 mmol/L (ref 20–29)
Calcium: 10.2 mg/dL (ref 8.7–10.2)
Chloride: 102 mmol/L (ref 96–106)
Creatinine, Ser: 1.06 mg/dL (ref 0.76–1.27)
GFR calc Af Amer: 112 mL/min/{1.73_m2} (ref >59)
GFR calc non Af Amer: 97 mL/min/{1.73_m2} (ref >59)
Globulin, Total: 2.5 g/dL (ref 1.5–4.5)
Glucose: 97 mg/dL (ref 65–99)
Potassium: 5.2 mmol/L (ref 3.5–5.2)
Sodium: 142 mmol/L (ref 134–144)
Total Protein: 7.3 g/dL (ref 6.0–8.5)

## 2020-03-06 LAB — CBC WITH DIFFERENTIAL/PLATELET
Basophils Absolute: 0.1 10*3/uL (ref 0.0–0.2)
Basos: 1 %
EOS (ABSOLUTE): 0.3 10*3/uL (ref 0.0–0.4)
Eos: 3 %
Hematocrit: 47.2 % (ref 37.5–51.0)
Hemoglobin: 15.8 g/dL (ref 13.0–17.7)
Immature Grans (Abs): 0 10*3/uL (ref 0.0–0.1)
Immature Granulocytes: 0 %
Lymphocytes Absolute: 2.6 10*3/uL (ref 0.7–3.1)
Lymphs: 34 %
MCH: 28.8 pg (ref 26.6–33.0)
MCHC: 33.5 g/dL (ref 31.5–35.7)
MCV: 86 fL (ref 79–97)
Monocytes Absolute: 0.5 10*3/uL (ref 0.1–0.9)
Monocytes: 7 %
Neutrophils Absolute: 4.2 10*3/uL (ref 1.4–7.0)
Neutrophils: 55 %
Platelets: 296 10*3/uL (ref 150–450)
RBC: 5.48 x10E6/uL (ref 4.14–5.80)
RDW: 12.2 % (ref 11.6–15.4)
WBC: 7.6 10*3/uL (ref 3.4–10.8)

## 2020-03-06 LAB — HIV ANTIBODY (ROUTINE TESTING W REFLEX): HIV Screen 4th Generation wRfx: NONREACTIVE

## 2020-03-06 LAB — VITAMIN D 25 HYDROXY (VIT D DEFICIENCY, FRACTURES): Vit D, 25-Hydroxy: 15.3 ng/mL — ABNORMAL LOW (ref 30.0–100.0)

## 2020-03-06 LAB — HEPATITIS C ANTIBODY: Hep C Virus Ab: <0.1 s/co ratio (ref 0.0–0.9)

## 2020-03-06 LAB — RPR: RPR Ser Ql: NONREACTIVE

## 2020-03-06 MED ORDER — VITAMIN D (ERGOCALCIFEROL) 1.25 MG (50000 UNIT) PO CAPS: 50000.0000 [IU] | ORAL_CAPSULE | ORAL | 0 refills | Status: DC

## 2020-03-06 NOTE — Progress Notes (Signed)
CBC, CMP, TSH within normal limits.  HIV, hepatitis C and RPR non reactive.    Total cholesterol , triglycerides, and LDL elevated.  Discuss lifestyle modification with patient e.g. increase exercise, fiber, fruits, vegetables, lean meat, and omega 3/fish intake and decrease saturated fat.  If patient following strict diet and exercise program already please schedule follow up appointment with primary care physician. Recheck lipids in 6 months advised.   Vitamin  D is low, this can contribute to poor sleep and fatigue, will send in prescription for Vitamin D at 50,000 units by mouth once every 7 days/(once weekly) for 12 weeks. Advise recheck lab Vitamin D in 1-2 weeks after completing vitamin d prescription. Lab iis walk in and is closed during lunch during regular office hours.   Sent to pharmacy and added vitamin D lab.  Needs repeat lipids in 6 months/

## 2020-03-06 NOTE — Progress Notes (Signed)
Meds ordered this encounter  Medications  . Vitamin D, Ergocalciferol, (DRISDOL) 1.25 MG (50000 UNIT) CAPS capsule    Sig: Take 1 capsule (50,000 Units total) by mouth every 7 (seven) days. (taking one tablet per week) walk in lab in office 1-2 weeks after completing prescription.    Dispense:  12 capsule    Refill:  0    Vitamin D insufficiency - Plan: VITAMIN D 25 Hydroxy (Vit-D Deficiency, Fractures), Vitamin D, Ergocalciferol, (DRISDOL) 1.25 MG (50000 UNIT) CAPS capsule

## 2021-02-12 DIAGNOSIS — M2391 Unspecified internal derangement of right knee: Secondary | ICD-10-CM | POA: Insufficient documentation

## 2021-02-12 DIAGNOSIS — M25561 Pain in right knee: Secondary | ICD-10-CM | POA: Insufficient documentation

## 2021-02-17 ENCOUNTER — Encounter: Payer: BC Managed Care – PPO | Admitting: Physician Assistant

## 2021-02-17 NOTE — Progress Notes (Deleted)
Complete physical exam   Patient: Gerald Reyes   DOB: December 09, 1995   25 y.o. Male  MRN: [id number] Visit Date: 02/17/2021  Today's healthcare provider: Alfredia Ferguson, PA-C   No chief complaint on file.  Subjective    Gerald Reyes is a 26 y.o. male who presents today for a complete physical exam.  He reports consuming a {diet types:17450} diet. {Exercise:19826} He generally feels {well/fairly well/poorly:18703}. He reports sleeping {well/fairly well/poorly:18703}. He {does/does not:200015} have additional problems to discuss today.  HPI  ***  No past medical history on file. Past Surgical History:  Procedure Laterality Date   ANTERIOR CRUCIATE LIGAMENT REPAIR  12/2013   MENISCUS REPAIR  12/2013   Social History   Socioeconomic History   Marital status: Single    Spouse name: Not on file   Number of children: Not on file   Years of education: Not on file   Highest education level: Not on file  Occupational History   Not on file  Tobacco Use   Smoking status: Never   Smokeless tobacco: Never  Substance and Sexual Activity   Alcohol use: Not Currently   Drug use: Never   Sexual activity: Not on file  Other Topics Concern   Not on file  Social History Narrative   Not on file   Social Determinants of Health   Financial Resource Strain: Not on file  Food Insecurity: Not on file  Transportation Needs: Not on file  Physical Activity: Not on file  Stress: Not on file  Social Connections: Not on file  Intimate Partner Violence: Not on file   No family status information on file.   No family history on file. No Known Allergies  Patient Care Team: Berniece Pap, FNP as PCP - General (Family Medicine)   Medications: Outpatient Medications Prior to Visit  Medication Sig   baclofen (LIORESAL) 10 MG tablet Take 1 tablet (10 mg total) by mouth at bedtime as needed for muscle spasms.   ibuprofen (ADVIL) 600 MG tablet Take 1 tablet (600 mg total) by mouth every  8 (eight) hours as needed for moderate pain.   Vitamin D, Ergocalciferol, (DRISDOL) 1.25 MG (50000 UNIT) CAPS capsule Take 1 capsule (50,000 Units total) by mouth every 7 (seven) days. (taking one tablet per week) walk in lab in office 1-2 weeks after completing prescription.   No facility-administered medications prior to visit.    Review of Systems  {Labs   Heme   Chem   Endocrine   Serology   Results Review (optional):23779}  Objective    There were no vitals taken for this visit. {Show previous vital signs (optional):23777}  Physical Exam  ***  Last depression screening scores PHQ 2/9 Scores 03/05/2020  PHQ - 2 Score 2  PHQ- 9 Score 5   Last fall risk screening Fall Risk  03/05/2020  Falls in the past year? 0  Number falls in past yr: 0  Injury with Fall? 0   Last Audit-C alcohol use screening Alcohol Use Disorder Test (AUDIT) 03/05/2020  1. How often do you have a drink containing alcohol? 2  2. How many drinks containing alcohol do you have on a typical day when you are drinking? 1  3. How often do you have six or more drinks on one occasion? 1  AUDIT-C Score 4   A score of 3 or more in women, and 4 or more in men indicates increased risk for alcohol abuse, EXCEPT if all of  the points are from question 1   No results found for any visits on 02/17/21.  Assessment & Plan    Routine Health Maintenance and Physical Exam  Exercise Activities and Dietary recommendations  Goals   None      There is no immunization history on file for this patient.  Health Maintenance  Topic Date Due   COVID-19 Vaccine (1) Never done   HPV VACCINES (1 - Male 2-dose series) Never done   INFLUENZA VACCINE  Never done   TETANUS/TDAP  03/05/2021 (Originally 08/10/2013)   Hepatitis C Screening  Completed   HIV Screening  Completed    Discussed health benefits of physical activity, and encouraged him to engage in regular exercise appropriate for his age and condition.  ***  No  follow-ups on file.     {provider attestation***:1}   Alfredia Ferguson, PA-C  [facility] [phone] (phone) [phone] (fax)  [facility]

## 2021-03-10 NOTE — Progress Notes (Signed)
I,Gerald Reyes,acting as a Education administrator for [employer], PA-C.,have documented all relevant documentation on the behalf of Gerald Kirschner, PA-C,as directed by  Gerald Kirschner, PA-C while in the presence of Gerald Kirschner, PA-C.  Complete physical exam   Patient: Gerald Reyes   DOB: September 01, 1995   26 y.o. Male  MRN: [id number] Visit Date: 03/11/2021  Today's healthcare provider: Mikey Kirschner, PA-C   Cc. cpe  Subjective    Gerald Reyes is a 26 y.o. male who presents today for a complete physical exam.  He reports consuming a general diet.  The patient reports exercising 3-4 times a week by doing cardio and jogging for at least an hour with moderate intensity  He generally feels well. He reports sleeping well. He does not have additional problems to discuss today.  Gerald Reyes is currently living at home with family, but is newly engaged and has plans to buy a home with his fiance.   He reports twisting his knee while playing soccer a month ago, was seen at [facility], no abnormalities. Wears a knee brace occasionally. He feels pain across the front of his knee and directly behind sometimes. Denies pain while walking or consistent pain at rest.  History reviewed. No pertinent past medical history. Past Surgical History:  Procedure Laterality Date   ANTERIOR CRUCIATE LIGAMENT REPAIR  12/2013   MENISCUS REPAIR  12/2013   Social History   Socioeconomic History   Marital status: Single    Spouse name: Not on file   Number of children: Not on file   Years of education: Not on file   Highest education level: Not on file  Occupational History   Not on file  Tobacco Use   Smoking status: Never   Smokeless tobacco: Never  Substance and Sexual Activity   Alcohol use: Not Currently   Drug use: Never   Sexual activity: Not on file  Other Topics Concern   Not on file  Social History Narrative   Not on file   Social Determinants of Health   Financial Resource Strain: Not on file  Food  Insecurity: Not on file  Transportation Needs: Not on file  Physical Activity: Not on file  Stress: Not on file  Social Connections: Not on file  Intimate Partner Violence: Not on file   No family status information on file.   History reviewed. No pertinent family history. No Known Allergies  Patient Care Team: Gerald Kirschner, PA-C as PCP - General (Physician Assistant)   Medications: Outpatient Medications Prior to Visit  Medication Sig   [DISCONTINUED] baclofen (LIORESAL) 10 MG tablet Take 1 tablet (10 mg total) by mouth at bedtime as needed for muscle spasms.   [DISCONTINUED] ibuprofen (ADVIL) 600 MG tablet Take 1 tablet (600 mg total) by mouth every 8 (eight) hours as needed for moderate pain.   [DISCONTINUED] Vitamin D, Ergocalciferol, (DRISDOL) 1.25 MG (50000 UNIT) CAPS capsule Take 1 capsule (50,000 Units total) by mouth every 7 (seven) days. (taking one tablet per week) walk in lab in office 1-2 weeks after completing prescription.   No facility-administered medications prior to visit.    Review of Systems  Constitutional:  Negative for fatigue and fever.  HENT:  Positive for congestion.   Respiratory:  Negative for cough and shortness of breath.   Cardiovascular:  Negative for chest pain, palpitations and leg swelling.  Genitourinary:  Positive for frequency.  Musculoskeletal:  Positive for arthralgias.  Neurological:  Negative for dizziness and headaches.  Objective    Blood pressure 122/69, pulse 96, temperature 98.5 F (36.9 C), temperature source Oral, height 5\' 11"  (1.803 m), weight 278 lb 1.6 oz (126.1 kg), SpO2 99 %.   Physical Exam Constitutional:      General: He is awake.     Appearance: He is well-developed.  HENT:     Head: Normocephalic.     Right Ear: Tympanic membrane, ear canal and external ear normal.     Left Ear: Tympanic membrane, ear canal and external ear normal.     Nose: Nose normal. No congestion or rhinorrhea.     Mouth/Throat:      Mouth: Mucous membranes are moist.     Pharynx: No oropharyngeal exudate or posterior oropharyngeal erythema.  Eyes:     Pupils: Pupils are equal, round, and reactive to light.  Cardiovascular:     Rate and Rhythm: Normal rate and regular rhythm.     Heart sounds: Normal heart sounds.  Pulmonary:     Effort: Pulmonary effort is normal.     Breath sounds: Normal breath sounds.  Abdominal:     General: There is no distension.     Palpations: Abdomen is soft.     Tenderness: There is no abdominal tenderness. There is no guarding.  Musculoskeletal:     Cervical back: Normal range of motion.     Right lower leg: No edema.     Left lower leg: No edema.  Lymphadenopathy:     Cervical: No cervical adenopathy.  Skin:    General: Skin is warm.  Neurological:     Mental Status: He is alert and oriented to person, place, and time.  Psychiatric:        Attention and Perception: Attention normal.        Mood and Affect: Mood normal.        Speech: Speech normal.        Behavior: Behavior normal. Behavior is cooperative.    Last depression screening scores PHQ 2/9 Scores 03/11/2021 03/05/2020  PHQ - 2 Score 1 2  PHQ- 9 Score 1 5   Last fall risk screening Fall Risk  03/11/2021  Falls in the past year? 0  Number falls in past yr: 0  Injury with Fall? 0  Risk for fall due to : No Fall Risks   Last Audit-C alcohol use screening Alcohol Use Disorder Test (AUDIT) 03/11/2021  1. How often do you have a drink containing alcohol? 2  2. How many drinks containing alcohol do you have on a typical day when you are drinking? 0  3. How often do you have six or more drinks on one occasion? 1  AUDIT-C Score 3   A score of 3 or more in women, and 4 or more in men indicates increased risk for alcohol abuse, EXCEPT if all of the points are from question 1   No results found for any visits on 03/11/21.  Assessment & Plan    Routine Health Maintenance and Physical Exam  Exercise Activities and  Dietary recommendations --balanced diet, low in fats, sugars, and carbs. --discussed intermittent fasting --discussed smaller meals more frequently  There is no immunization history on file for this patient.  Health Maintenance  Topic Date Due   COVID-19 Vaccine (1) 03/27/2021 (Originally 02/11/1996)   INFLUENZA VACCINE  04/16/2021 (Originally 08/17/2020)   HPV VACCINES (1 - Male 2-dose series) 03/11/2022 (Originally 08/10/2005)   TETANUS/TDAP  03/11/2022 (Originally 08/10/2013)   Hepatitis C Screening  Completed  HIV Screening  Completed   Declines all vaccinations today. Discussed health benefits of physical activity, and encouraged him to engage in regular exercise appropriate for his age and condition.  Problem List Items Addressed This Visit       Other   Avitaminosis D    Recommended 2,000 IU daily Will recheck level      Relevant Orders   Vitamin D (25 hydroxy)   BMI 38.0-38.9,adult    Discussed plan for weight loss, diet, exercise. Advised at any point he could be referred to a dietician if preferred.       Relevant Orders   HgB A1c   CBC with Differential/Platelet   Comprehensive metabolic panel   Acute pain of right knee    Self- monitor, advil prn, ice prn Advised he get back into exercise, wearing the brace is fine If continued pain would refer to pt      Other Visit Diagnoses     Encounter for health maintenance examination    -  Primary   Relevant Orders   HgB A1c   CBC with Differential/Platelet   Comprehensive metabolic panel   Moderate mixed hyperlipidemia not requiring statin therapy       Relevant Orders   Lipid panel   Comprehensive metabolic panel        Return in about 1 year (around 03/11/2022) for CPE.     I, Gerald Kirschner, PA-C have reviewed all documentation for this visit. The documentation on  03/11/2021 for the exam, diagnosis, procedures, and orders are all accurate and complete.    Gerald Kirschner, PA-C [facility] [address] [place], [place], [address] Office: [phone] Fax: [place]

## 2021-03-11 ENCOUNTER — Ambulatory Visit (INDEPENDENT_AMBULATORY_CARE_PROVIDER_SITE_OTHER): Payer: BC Managed Care – PPO | Admitting: Physician Assistant

## 2021-03-11 ENCOUNTER — Encounter: Payer: Self-pay | Admitting: Physician Assistant

## 2021-03-11 ENCOUNTER — Other Ambulatory Visit: Payer: Self-pay

## 2021-03-11 VITALS — BP 122/69 | HR 96 | Temp 98.5°F | Ht 71.0 in | Wt 278.1 lb

## 2021-03-11 DIAGNOSIS — Z0000 Encounter for general adult medical examination without abnormal findings: Secondary | ICD-10-CM | POA: Diagnosis not present

## 2021-03-11 DIAGNOSIS — E559 Vitamin D deficiency, unspecified: Secondary | ICD-10-CM

## 2021-03-11 DIAGNOSIS — E782 Mixed hyperlipidemia: Secondary | ICD-10-CM

## 2021-03-11 DIAGNOSIS — Z6838 Body mass index (BMI) 38.0-38.9, adult: Secondary | ICD-10-CM | POA: Diagnosis not present

## 2021-03-11 DIAGNOSIS — M25561 Pain in right knee: Secondary | ICD-10-CM

## 2021-03-11 NOTE — Assessment & Plan Note (Signed)
Discussed plan for weight loss, diet, exercise. Advised at any point he could be referred to a dietician if preferred.

## 2021-03-11 NOTE — Assessment & Plan Note (Signed)
Recommended 2,000 IU daily Will recheck level

## 2021-03-11 NOTE — Assessment & Plan Note (Signed)
Self- monitor, advil prn, ice prn Advised he get back into exercise, wearing the brace is fine If continued pain would refer to pt

## 2021-03-11 NOTE — Patient Instructions (Signed)
HPV and Cancer Information HPV (human papillomavirus)is a very common virus that spreads easily from person to person through skin-to-skin or sexual contact. There are many types of HPV. It often does not cause symptoms. However, depending upon the type, it may sometimes cause warts in the genitals (genital or mucosal HPV), or on the hands or feet (cutaneous or nonmucosal HPV). It is possible to be infected for a long time and pass HPV to others withoutknowing it. Some HPV infections go away on their own within 2 years, but other HPV infections are considered high-risk and may cause changes in cells that could lead to cancer. You can take steps to avoid HPV infection and to lower yourrisk of getting cancer. How can HPV affect me? HPV can cause warts in the genitals or on the hands or feet. It can also causewart-like lesions in the throat.  Certain types of genital HPV can also cause cancer, which may include: Cervical cancer. Vaginal cancer. Vulvar cancer. Anal cancer. Throat cancer. Tongue or mouth cancer. Penile cancer. How does HPV spread? HPV spreads easily through direct person to person contact. Genital HPV spreads through sexual contact. You can get HPV from vaginal sex, oral sex, anal sex, or just by touching someone's genitals. Even people who have only one sexual partner may have HPV because that partner may have it. HPV often does not causesymptoms, so most infected people do not know that they have it. What actions can I take to prevent HPV? Take the following steps to help prevent HPV infection: Talk with your health care provider about getting the HPV vaccine. This vaccine protects against the types of HPV that could cause cancer. Limit the number of people you have sex with. Also, avoid having sex with people who have had many sexual partners. Use a condom during sex. Talk with your sexual partners about their health. What actions can I take to lower my risk for cancer? Having a  healthy lifestyle and taking some preventive steps can help lower your cancer risk, whether or not you have genital HPV. Some steps you can takeinclude: Lifestyle Practice safe sex to help prevent HPV infection. Do not use any products that contain nicotine or tobacco, such as cigarettes, e-cigarettes, and chewing tobacco. If you need help quitting, ask your health care provider. Eat foods that have antioxidants, such as fruits, vegetables, and grains. Try to eat at least 5 servings of fruits and vegetables every day. Get regular exercise. Lose weight if you are overweight. Practice good oral hygiene. This includes flossing and brushing your teeth every day. Other preventive steps Get the HPV vaccine as told by your health care provider. Get tested for STIs even if you do not have symptoms of HPV. You may have HPV and not know it. If you are a woman, get regular Pap and HPV tests. Talk with your health care provider about how often you need these tests. Pap tests will help identify changes in cells that can lead to cancer. HPV tests will help identify the presence of HPV in cells in the cervix. Where to find more information Learn more about HPV and cancer from: Centers for Disease Control and Prevention: www.cdc.gov/hpv National Cancer Institute: www.cancer.gov American Cancer Society: www.cancer.org Contact a health care provider if: You have genital warts. You are sexually active and think you may have HPV. You did not protect yourself during sex and would like to be tested for STIs. Summary Human papillomavirus (HPV) is a very common virus that   spreads easily from person to person and ishighly contagious. Certain types of genital HPV are considered to be high risk and may cause changes in cells that could lead to cancer. You should take steps to avoid HPV infection, such as limiting the number of people you have sex with, using condoms during sex, and getting the HPV vaccine. Lifestyle  changes can help lower your risk of cancer. These include eating a healthy diet, getting regular exercise, and not using any products that contain nicotine or tobacco. You may have HPV and not know it. Get tested for STIs even if you do not have symptoms of HPV. If you are a woman, have regular Pap tests and HPV tests as directed by your health care provider. This information is not intended to replace advice given to you by your health care provider. Make sure you discuss any questions you have with your healthcare provider. Document Revised: 08/20/2019 Document Reviewed: 08/20/2019 Elsevier Patient Education  2022 Elsevier Inc.  

## 2021-03-12 ENCOUNTER — Other Ambulatory Visit: Payer: Self-pay | Admitting: Physician Assistant

## 2021-03-12 DIAGNOSIS — E559 Vitamin D deficiency, unspecified: Secondary | ICD-10-CM

## 2021-03-12 LAB — CBC WITH DIFFERENTIAL/PLATELET
Basophils Absolute: 0.1 10*3/uL (ref 0.0–0.2)
Basos: 1 %
EOS (ABSOLUTE): 0.3 10*3/uL (ref 0.0–0.4)
Eos: 4 %
Hematocrit: 44.4 % (ref 37.5–51.0)
Hemoglobin: 15.1 g/dL (ref 13.0–17.7)
Immature Grans (Abs): 0 10*3/uL (ref 0.0–0.1)
Immature Granulocytes: 0 %
Lymphocytes Absolute: 2 10*3/uL (ref 0.7–3.1)
Lymphs: 21 %
MCH: 28.9 pg (ref 26.6–33.0)
MCHC: 34 g/dL (ref 31.5–35.7)
MCV: 85 fL (ref 79–97)
Monocytes Absolute: 0.7 10*3/uL (ref 0.1–0.9)
Monocytes: 7 %
Neutrophils Absolute: 6.4 10*3/uL (ref 1.4–7.0)
Neutrophils: 67 %
Platelets: 279 10*3/uL (ref 150–450)
RBC: 5.22 x10E6/uL (ref 4.14–5.80)
RDW: 12.4 % (ref 11.6–15.4)
WBC: 9.4 10*3/uL (ref 3.4–10.8)

## 2021-03-12 LAB — LIPID PANEL
Chol/HDL Ratio: 5.7 ratio — ABNORMAL HIGH (ref 0.0–5.0)
Cholesterol, Total: 181 mg/dL (ref 100–199)
HDL: 32 mg/dL — ABNORMAL LOW (ref >39)
LDL Chol Calc (NIH): 101 mg/dL — ABNORMAL HIGH (ref 0–99)
Triglycerides: 282 mg/dL — ABNORMAL HIGH (ref 0–149)
VLDL Cholesterol Cal: 48 mg/dL — ABNORMAL HIGH (ref 5–40)

## 2021-03-12 LAB — COMPREHENSIVE METABOLIC PANEL
ALT: 36 IU/L (ref 0–44)
AST: 28 IU/L (ref 0–40)
Albumin/Globulin Ratio: 1.8 (ref 1.2–2.2)
Albumin: 4.8 g/dL (ref 4.1–5.2)
Alkaline Phosphatase: 76 IU/L (ref 44–121)
BUN/Creatinine Ratio: 13 (ref 9–20)
BUN: 14 mg/dL (ref 6–20)
Bilirubin Total: 0.7 mg/dL (ref 0.0–1.2)
CO2: 25 mmol/L (ref 20–29)
Calcium: 10.3 mg/dL — ABNORMAL HIGH (ref 8.7–10.2)
Chloride: 99 mmol/L (ref 96–106)
Creatinine, Ser: 1.1 mg/dL (ref 0.76–1.27)
Globulin, Total: 2.6 g/dL (ref 1.5–4.5)
Glucose: 88 mg/dL (ref 70–99)
Potassium: 5.1 mmol/L (ref 3.5–5.2)
Sodium: 139 mmol/L (ref 134–144)
Total Protein: 7.4 g/dL (ref 6.0–8.5)
eGFR: 95 mL/min/{1.73_m2} (ref >59)

## 2021-03-12 LAB — HEMOGLOBIN A1C
Est. average glucose Bld gHb Est-mCnc: 114 mg/dL
Hgb A1c MFr Bld: 5.6 % (ref 4.8–5.6)

## 2021-03-12 LAB — VITAMIN D 25 HYDROXY (VIT D DEFICIENCY, FRACTURES): Vit D, 25-Hydroxy: 14 ng/mL — ABNORMAL LOW (ref 30.0–100.0)

## 2021-03-12 MED ORDER — CHOLECALCIFEROL 1.25 MG (50000 UT) PO CAPS: 50000.0000 [IU] | ORAL_CAPSULE | ORAL | 0 refills | Status: DC

## 2021-08-17 ENCOUNTER — Ambulatory Visit (INDEPENDENT_AMBULATORY_CARE_PROVIDER_SITE_OTHER): Payer: BC Managed Care – PPO | Admitting: Physician Assistant

## 2021-08-17 ENCOUNTER — Encounter: Payer: Self-pay | Admitting: Physician Assistant

## 2021-08-17 ENCOUNTER — Other Ambulatory Visit: Payer: Self-pay | Admitting: Physician Assistant

## 2021-08-17 VITALS — BP 116/72 | HR 72 | Ht 71.0 in | Wt 274.2 lb

## 2021-08-17 DIAGNOSIS — Z23 Encounter for immunization: Secondary | ICD-10-CM | POA: Diagnosis not present

## 2021-08-17 DIAGNOSIS — Z111 Encounter for screening for respiratory tuberculosis: Secondary | ICD-10-CM

## 2021-08-17 NOTE — Progress Notes (Signed)
     I,Sha'taria Tyson,acting as a Neurosurgeon for [employer], PA-C.,have documented all relevant documentation on the behalf of Alfredia Ferguson, PA-C,as directed by  Alfredia Ferguson, PA-C while in the presence of Alfredia Ferguson, PA-C.   Established patient visit   Patient: Gerald Reyes   DOB: [birth year]   26 y.o. Male  MRN: [id number] Visit Date: 08/17/2021  Today's healthcare provider: Alfredia Ferguson, PA-C   Cc. Health examination forms  Subjective    HPI  Patient is in today to have forms filled out for work. CPE 03/11/21. 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   Medications: Outpatient Medications Prior to Visit  Medication Sig   Cholecalciferol 1.25 MG (50000 UT) capsule Take 1 capsule (50,000 Units total) by mouth once a week.   No facility-administered medications prior to visit.    Review of Systems  Constitutional:  Negative for fatigue and fever.  Respiratory:  Negative for cough and shortness of breath.   Cardiovascular:  Negative for chest pain, palpitations and leg swelling.  Neurological:  Negative for dizziness and headaches.      Objective    Blood pressure 116/72, pulse 72, height 5\' 11"  (1.803 m), weight 274 lb 3.2 oz (124.4 kg), SpO2 100 %.   Physical Exam Vitals reviewed.  Constitutional:      Appearance: He is not ill-appearing.  HENT:     Head: Normocephalic.  Eyes:     Conjunctiva/sclera: Conjunctivae normal.  Cardiovascular:     Rate and Rhythm: Normal rate.  Pulmonary:     Effort: Pulmonary effort is normal. No respiratory distress.  Neurological:     General: No focal deficit present.     Mental Status: He is alert and oriented to person, place, and time.  Psychiatric:        Mood and Affect: Mood normal.        Behavior: Behavior normal.      No results found for any visits on 08/17/21.  Assessment & Plan     Based on cpe 2/23 ok to sign papers, pt advised they are recommending a tb screen, offered quantiferon gold but advised pt  confirm with company and form is requesting skin test. Advised he go to health dept for skin test and I can check in 48 hours. Will hold on to form for now.   Problem List Items Addressed This Visit   None Visit Diagnoses     Need for Td vaccine    -  Primary   Relevant Orders   Td : Tetanus/diphtheria >7yo Preservative  free (Completed)       I, 3/23, PA-C have reviewed all documentation for this visit. The documentation on  08/17/2021 for the exam, diagnosis, procedures, and orders are all accurate and complete.  10/17/2021, PA-C [facility] [address] [place], [place], [place] Office: [phone] Fax: [phone]   [facility]

## 2021-08-18 DIAGNOSIS — Z111 Encounter for screening for respiratory tuberculosis: Secondary | ICD-10-CM | POA: Diagnosis not present

## 2021-08-20 LAB — QUANTIFERON-TB GOLD PLUS

## 2021-08-21 LAB — QUANTIFERON-TB GOLD PLUS: QuantiFERON TB1 Ag Value: 0.08 IU/mL

## 2021-12-17 DIAGNOSIS — Z419 Encounter for procedure for purposes other than remedying health state, unspecified: Secondary | ICD-10-CM | POA: Diagnosis not present

## 2022-01-17 DIAGNOSIS — Z419 Encounter for procedure for purposes other than remedying health state, unspecified: Secondary | ICD-10-CM | POA: Diagnosis not present

## 2022-02-17 DIAGNOSIS — Z419 Encounter for procedure for purposes other than remedying health state, unspecified: Secondary | ICD-10-CM | POA: Diagnosis not present

## 2022-03-14 ENCOUNTER — Ambulatory Visit (INDEPENDENT_AMBULATORY_CARE_PROVIDER_SITE_OTHER): Payer: BC Managed Care – PPO | Admitting: Physician Assistant

## 2022-03-14 ENCOUNTER — Encounter: Payer: Self-pay | Admitting: Physician Assistant

## 2022-03-14 VITALS — BP 126/80 | Ht 71.0 in | Wt 295.3 lb

## 2022-03-14 DIAGNOSIS — Z6841 Body Mass Index (BMI) 40.0 and over, adult: Secondary | ICD-10-CM | POA: Diagnosis not present

## 2022-03-14 DIAGNOSIS — E782 Mixed hyperlipidemia: Secondary | ICD-10-CM

## 2022-03-14 DIAGNOSIS — E559 Vitamin D deficiency, unspecified: Secondary | ICD-10-CM

## 2022-03-14 DIAGNOSIS — Z0000 Encounter for general adult medical examination without abnormal findings: Secondary | ICD-10-CM | POA: Diagnosis not present

## 2022-03-14 NOTE — Assessment & Plan Note (Signed)
Discussed diet/exercise

## 2022-03-14 NOTE — Assessment & Plan Note (Signed)
Will repeat vit d level No longer taking supplement

## 2022-03-14 NOTE — Progress Notes (Signed)
I,Gerald Reyes,acting as a Education administrator for [employer], PA-C.,have documented all relevant documentation on the behalf of Gerald Kirschner, PA-C,as directed by  Gerald Kirschner, PA-C while in the presence of Gerald Kirschner, PA-C.   Complete physical exam   Patient: Gerald Reyes   DOB: 1995-10-31   27 y.o. Male  MRN: [id number] Visit Date: 03/14/2022  Today's healthcare provider: Mikey Kirschner, PA-C   Cc.cpe   Subjective    Gerald Reyes is a 27 y.o. male who presents today for a complete physical exam.  He reports consuming a general diet.  The patient reports going to the gym three times a week for a hour consisting of weight lifting   He generally feels well. He reports sleeping well. He does not have additional problems to discuss today.  HPI     History reviewed. No pertinent past medical history. Past Surgical History:  Procedure Laterality Date   ANTERIOR CRUCIATE LIGAMENT REPAIR  12/2013   MENISCUS REPAIR  12/2013   Social History   Socioeconomic History   Marital status: Single    Spouse name: Not on file   Number of children: Not on file   Years of education: Not on file   Highest education level: Not on file  Occupational History   Not on file  Tobacco Use   Smoking status: Never   Smokeless tobacco: Never  Substance and Sexual Activity   Alcohol use: Not Currently   Drug use: Never   Sexual activity: Yes    Birth control/protection: Condom  Other Topics Concern   Not on file  Social History Narrative   Not on file   Social Determinants of Health   Financial Resource Strain: Not on file  Food Insecurity: Not on file  Transportation Needs: Not on file  Physical Activity: Not on file  Stress: Not on file  Social Connections: Not on file  Intimate Partner Violence: Not on file   No family status information on file.   History reviewed. No pertinent family history. No Known Allergies  Patient Care Team: Gerald Kirschner, PA-C as PCP - General  (Physician Assistant)   Medications: Outpatient Medications Prior to Visit  Medication Sig   [DISCONTINUED] Cholecalciferol 1.25 MG (50000 UT) capsule Take 1 capsule (50,000 Units total) by mouth once a week. (Patient not taking: Reported on 08/17/2021)   [DISCONTINUED] meloxicam (MOBIC) 15 MG tablet Take 1 tablet every day by oral route. (Patient not taking: Reported on 08/17/2021)   No facility-administered medications prior to visit.    Review of Systems  Constitutional:  Negative for fatigue and fever.  Respiratory:  Negative for cough and shortness of breath.   Cardiovascular:  Negative for chest pain, palpitations and leg swelling.  Genitourinary:  Positive for frequency.  Neurological:  Negative for dizziness and headaches.     Objective    BP 126/80 (BP Location: Right Arm, Patient Position: Sitting, Cuff Size: Large)   Ht '5\' 11"'$  (1.803 m)   Wt 295 lb 4.8 oz (133.9 kg)   SpO2 100%   BMI 41.19 kg/m     Physical Exam Constitutional:      General: He is awake.     Appearance: He is well-developed.  HENT:     Head: Normocephalic.     Right Ear: Tympanic membrane, ear canal and external ear normal.     Left Ear: Tympanic membrane, ear canal and external ear normal.     Nose: Nose normal. No congestion or rhinorrhea.  Mouth/Throat:     Mouth: Mucous membranes are moist.     Pharynx: No oropharyngeal exudate or posterior oropharyngeal erythema.  Eyes:     Pupils: Pupils are equal, round, and reactive to light.  Cardiovascular:     Rate and Rhythm: Normal rate and regular rhythm.     Heart sounds: Normal heart sounds.  Pulmonary:     Effort: Pulmonary effort is normal.     Breath sounds: Normal breath sounds.  Abdominal:     General: There is no distension.     Palpations: Abdomen is soft.     Tenderness: There is no abdominal tenderness. There is no guarding.  Musculoskeletal:     Cervical back: Normal range of motion.     Right lower leg: No edema.     Left  lower leg: No edema.  Lymphadenopathy:     Cervical: No cervical adenopathy.  Skin:    General: Skin is warm.  Neurological:     Mental Status: He is alert and oriented to person, place, and time.  Psychiatric:        Attention and Perception: Attention normal.        Mood and Affect: Mood normal.        Speech: Speech normal.        Behavior: Behavior normal. Behavior is cooperative.     Last depression screening scores    03/14/2022   10:01 AM 03/11/2021   10:11 AM 03/05/2020    9:24 AM  PHQ 2/9 Scores  PHQ - 2 Score 0 1 2  PHQ- 9 Score '1 1 5   '$ Last fall risk screening    03/14/2022   10:01 AM  Fall Risk   Falls in the past year? 0  Number falls in past yr: 0  Injury with Fall? 0  Risk for fall due to : No Fall Risks  Follow up Falls evaluation completed   Last Audit-C alcohol use screening    03/14/2022   10:01 AM  Alcohol Use Disorder Test (AUDIT)  1. How often do you have a drink containing alcohol? 1  2. How many drinks containing alcohol do you have on a typical day when you are drinking? 1  3. How often do you have six or more drinks on one occasion? 1  AUDIT-C Score 3   A score of 3 or more in women, and 4 or more in men indicates increased risk for alcohol abuse, EXCEPT if all of the points are from question 1   No results found for any visits on 03/14/22.  Assessment & Plan    Routine Health Maintenance and Physical Exam  Exercise Activities and Dietary recommendations --balanced diet high in fiber and protein, low in sugars, carbs, fats. --physical activity/exercise 30 minutes 3-5 times a week     Immunization History  Administered Date(s) Administered   DTaP 10/07/1994, 12/12/1994, 02/21/1995, 10/20/1995, 08/14/1998   HIB (PRP-OMP) 10/07/1994, 12/12/1994, 02/21/1995, 10/20/1995   HPV 9-valent 11/20/2008, 01/21/2009, 05/25/2009   Hepatitis A 08/18/2006, 02/20/2007   Hepatitis B 11/25/94, 10/07/1994, 02/21/1995   Influenza-Unspecified  11/29/2004, 12/25/2006, 11/20/2008   MMR 08/14/1995, 08/14/1998   MenQuadfi_Meningococcal Groups ACYW Conjugate 08/18/2006   Meningococcal Conjugate 08/17/2010   OPV 10/07/1994, 12/12/1994, 02/21/1995, 08/14/1998   Td 08/17/2021   Tdap 08/18/2006    Health Maintenance  Topic Date Due   COVID-19 Vaccine (1) 03/30/2022 (Originally 02/11/1995)   INFLUENZA VACCINE  04/17/2022 (Originally 08/17/2021)   DTaP/Tdap/Td (8 - Td or Tdap) 08/18/2031  HPV VACCINES  Completed   Hepatitis C Screening  Completed   HIV Screening  Completed    Discussed health benefits of physical activity, and encouraged him to engage in regular exercise appropriate for his age and condition.  Problem List Items Addressed This Visit       Other   Avitaminosis D    Will repeat vit d level No longer taking supplement      Relevant Orders   Vitamin D (25 hydroxy)   Moderate mixed hyperlipidemia not requiring statin therapy   Relevant Orders   Lipid panel   Morbid obesity ([place])    Discussed diet/exercise      Other Visit Diagnoses     Annual physical exam    -  Primary   Relevant Orders   CBC with Differential/Platelet   Comprehensive metabolic panel   BMI [id number], adult ([place])            Return in about 1 year (around 03/15/2023) for CPE.     I, Gerald Kirschner, PA-C have reviewed all documentation for this visit. The documentation on    for the exam, diagnosis, procedures, and orders are all accurate and complete.  Gerald Kirschner, PA-C [facility] [address] [place], [place], [address] Office: [phone] Fax: [place]

## 2022-03-15 ENCOUNTER — Other Ambulatory Visit: Payer: Self-pay | Admitting: Physician Assistant

## 2022-03-15 DIAGNOSIS — E559 Vitamin D deficiency, unspecified: Secondary | ICD-10-CM

## 2022-03-15 LAB — CBC WITH DIFFERENTIAL/PLATELET
Basophils Absolute: 0 10*3/uL (ref 0.0–0.2)
Basos: 1 %
EOS (ABSOLUTE): 0.4 10*3/uL (ref 0.0–0.4)
Eos: 5 %
Hematocrit: 45 % (ref 37.5–51.0)
Hemoglobin: 15.2 g/dL (ref 13.0–17.7)
Immature Grans (Abs): 0 10*3/uL (ref 0.0–0.1)
Immature Granulocytes: 0 %
Lymphocytes Absolute: 2.4 10*3/uL (ref 0.7–3.1)
Lymphs: 35 %
MCH: 29.3 pg (ref 26.6–33.0)
MCHC: 33.8 g/dL (ref 31.5–35.7)
MCV: 87 fL (ref 79–97)
Monocytes Absolute: 0.4 10*3/uL (ref 0.1–0.9)
Monocytes: 6 %
Neutrophils Absolute: 3.6 10*3/uL (ref 1.4–7.0)
Neutrophils: 53 %
Platelets: 315 10*3/uL (ref 150–450)
RBC: 5.19 x10E6/uL (ref 4.14–5.80)
RDW: 12.4 % (ref 11.6–15.4)
WBC: 6.9 10*3/uL (ref 3.4–10.8)

## 2022-03-15 LAB — LIPID PANEL
Chol/HDL Ratio: 5.8 ratio — ABNORMAL HIGH (ref 0.0–5.0)
Cholesterol, Total: 187 mg/dL (ref 100–199)
HDL: 32 mg/dL — ABNORMAL LOW (ref >39)
LDL Chol Calc (NIH): 72 mg/dL (ref 0–99)
Triglycerides: 529 mg/dL — ABNORMAL HIGH (ref 0–149)
VLDL Cholesterol Cal: 83 mg/dL — ABNORMAL HIGH (ref 5–40)

## 2022-03-15 LAB — COMPREHENSIVE METABOLIC PANEL
ALT: 42 IU/L (ref 0–44)
AST: 29 IU/L (ref 0–40)
Albumin/Globulin Ratio: 2.4 — ABNORMAL HIGH (ref 1.2–2.2)
Albumin: 4.3 g/dL (ref 4.3–5.2)
Alkaline Phosphatase: 61 IU/L (ref 44–121)
BUN/Creatinine Ratio: 13 (ref 9–20)
BUN: 12 mg/dL (ref 6–20)
Bilirubin Total: 0.4 mg/dL (ref 0.0–1.2)
CO2: 23 mmol/L (ref 20–29)
Calcium: 9.8 mg/dL (ref 8.7–10.2)
Chloride: 101 mmol/L (ref 96–106)
Creatinine, Ser: 0.91 mg/dL (ref 0.76–1.27)
Globulin, Total: 1.8 g/dL (ref 1.5–4.5)
Glucose: 93 mg/dL (ref 70–99)
Potassium: 4.8 mmol/L (ref 3.5–5.2)
Sodium: 140 mmol/L (ref 134–144)
Total Protein: 6.1 g/dL (ref 6.0–8.5)
eGFR: 118 mL/min/{1.73_m2} (ref >59)

## 2022-03-15 LAB — VITAMIN D 25 HYDROXY (VIT D DEFICIENCY, FRACTURES): Vit D, 25-Hydroxy: 8 ng/mL — ABNORMAL LOW (ref 30.0–100.0)

## 2022-03-15 MED ORDER — CHOLECALCIFEROL 1.25 MG (50000 UT) PO CAPS: 50000.0000 [IU] | ORAL_CAPSULE | ORAL | 0 refills | Status: AC

## 2022-03-18 DIAGNOSIS — Z419 Encounter for procedure for purposes other than remedying health state, unspecified: Secondary | ICD-10-CM | POA: Diagnosis not present

## 2022-04-18 DIAGNOSIS — Z419 Encounter for procedure for purposes other than remedying health state, unspecified: Secondary | ICD-10-CM | POA: Diagnosis not present

## 2022-05-18 DIAGNOSIS — Z419 Encounter for procedure for purposes other than remedying health state, unspecified: Secondary | ICD-10-CM | POA: Diagnosis not present

## 2022-06-18 DIAGNOSIS — Z419 Encounter for procedure for purposes other than remedying health state, unspecified: Secondary | ICD-10-CM | POA: Diagnosis not present

## 2022-07-18 DIAGNOSIS — Z419 Encounter for procedure for purposes other than remedying health state, unspecified: Secondary | ICD-10-CM | POA: Diagnosis not present

## 2022-08-18 DIAGNOSIS — Z419 Encounter for procedure for purposes other than remedying health state, unspecified: Secondary | ICD-10-CM | POA: Diagnosis not present

## 2022-09-18 DIAGNOSIS — Z419 Encounter for procedure for purposes other than remedying health state, unspecified: Secondary | ICD-10-CM | POA: Diagnosis not present

## 2022-10-18 DIAGNOSIS — Z419 Encounter for procedure for purposes other than remedying health state, unspecified: Secondary | ICD-10-CM | POA: Diagnosis not present

## 2022-11-18 DIAGNOSIS — Z419 Encounter for procedure for purposes other than remedying health state, unspecified: Secondary | ICD-10-CM | POA: Diagnosis not present

## 2022-12-18 DIAGNOSIS — Z419 Encounter for procedure for purposes other than remedying health state, unspecified: Secondary | ICD-10-CM | POA: Diagnosis not present

## 2023-01-18 DIAGNOSIS — Z419 Encounter for procedure for purposes other than remedying health state, unspecified: Secondary | ICD-10-CM | POA: Diagnosis not present

## 2023-02-18 DIAGNOSIS — Z419 Encounter for procedure for purposes other than remedying health state, unspecified: Secondary | ICD-10-CM | POA: Diagnosis not present

## 2023-03-11 ENCOUNTER — Telehealth: Payer: Self-pay | Admitting: Family Medicine

## 2023-03-11 ENCOUNTER — Ambulatory Visit
Admission: RE | Admit: 2023-03-11 | Discharge: 2023-03-11 | Disposition: A | Discharge status: Home / Self Care | Payer: BC Managed Care – PPO | Source: Ambulatory Visit | Attending: Family Medicine

## 2023-03-11 ENCOUNTER — Ambulatory Visit
Admission: RE | Admit: 2023-03-11 | Discharge: 2023-03-11 | Disposition: A | Discharge status: Home / Self Care | Payer: BC Managed Care – PPO | Source: Ambulatory Visit | Attending: Family Medicine | Admitting: Family Medicine

## 2023-03-11 VITALS — BP 129/87 | HR 64 | Temp 97.8°F | Resp 16 | Ht 71.0 in | Wt 295.2 lb

## 2023-03-11 DIAGNOSIS — Z113 Encounter for screening for infections with a predominantly sexual mode of transmission: Secondary | ICD-10-CM | POA: Diagnosis not present

## 2023-03-11 DIAGNOSIS — N50811 Right testicular pain: Secondary | ICD-10-CM

## 2023-03-11 DIAGNOSIS — N433 Hydrocele, unspecified: Secondary | ICD-10-CM | POA: Insufficient documentation

## 2023-03-11 LAB — URINALYSIS, W/ REFLEX TO CULTURE (INFECTION SUSPECTED)
Bacteria, UA: NONE SEEN
Bilirubin Urine: NEGATIVE
Glucose, UA: NEGATIVE mg/dL
Hgb urine dipstick: NEGATIVE
Ketones, ur: NEGATIVE mg/dL
Leukocytes,Ua: NEGATIVE
Nitrite: NEGATIVE
Protein, ur: NEGATIVE mg/dL
RBC / HPF: NONE SEEN RBC/hpf (ref 0–5)
Specific Gravity, Urine: 1.025 (ref 1.005–1.030)
Squamous Epithelial / HPF: NONE SEEN /[HPF] (ref 0–5)
WBC, UA: NONE SEEN WBC/hpf (ref 0–5)
pH: 5.5 (ref 5.0–8.0)

## 2023-03-11 MED ORDER — DOXYCYCLINE HYCLATE 100 MG PO CAPS: 100.0000 mg | ORAL_CAPSULE | Freq: Two times a day (BID) | ORAL | 0 refills | Status: AC

## 2023-03-11 NOTE — ED Triage Notes (Signed)
 Patient reports right testicle pain that started on Tuesday.  Patient denies any redness or swelling.  Patient denies any blood in his urine.  Patient reports some burning when urinating .

## 2023-03-11 NOTE — Discharge Instructions (Addendum)
 Go to the medical mall to have an ultrasound performed. Look for signs at [facility] for the [facility]. Enter through the medical mall and go to the radiology department for your ultrasound.    Your urine sample did not show any blood or infection.  Your STD test results will be available in the next 36-72 hours. If positive, someone will contact you.  You should see your results in your MyChart account.

## 2023-03-11 NOTE — ED Provider Notes (Signed)
 [facility]    CSN: [id number] Arrival date & time: 03/11/23  0841      History   Chief Complaint Chief Complaint  Patient presents with   Testicle Pain    right    HPI  HPI  Gerald Reyes is a 28 y.o. male right sided testicular pain with dysuria that started on Tuesday.  Took some ibuprofen which helps some what. 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  He pain with urinating. No fever, \vomiting, diarrhea, cough.  He had pain on his right thigh before his testicular pain started. No history of blood clots in legs or lungs. Denies known STI exposure.Cledith does not use condoms regularly. No similar sx previously.   - Penile discharge: no  -Testicular pain: right side only   - Testicular swelling: no  - Fever: no - Abdominal pain: no - Rash: no - Arthralgias: no - Nausea: no - Vomiting: no - Dysuria: yes  - Back Pain: not new  - Headache: no       History reviewed. No pertinent past medical history.  Patient Active Problem List   Diagnosis Date Noted   Moderate mixed hyperlipidemia not requiring statin therapy 03/14/2022   Morbid obesity (H[facility]) 03/14/2022   Avitaminosis D 03/05/2020   BMI 38.0-38.9,adult 03/05/2020    Past Surgical History:  Procedure Laterality Date   ANTERIOR CRUCIATE LIGAMENT REPAIR  12/2013   MENISCUS REPAIR  12/2013       Home Medications    Prior to Admission medications   Medication Sig Start Date End Date Taking? Authorizing Provider  Cholecalciferol 1.25 MG (50000 UT) capsule Take 1 capsule (50,000 Units total) by mouth once a week. 03/15/22   Alfredia Ferguson, PA-C  doxycycline (VIBRAMYCIN) 100 MG capsule Take 1 capsule (100 mg total) by mouth 2 (two) times daily. 03/11/23   Katha Cabal, DO    Family History History reviewed. No pertinent family history.  Social History Social History   Tobacco Use   Smoking status: Never   Smokeless tobacco: Never  Vaping Use    Vaping status: Never Used  Substance Use Topics   Alcohol use: Not Currently   Drug use: Never     Allergies   Patient has no known allergies.   Review of Systems Review of Systems: negative unless otherwise stated in HPI.      Physical Exam Triage Vital Signs ED Triage Vitals  Encounter Vitals Group     BP 03/11/23 0849 129/87     Systolic BP Percentile --      Diastolic BP Percentile --      Pulse Rate 03/11/23 0849 64     Resp 03/11/23 0849 16     Temp 03/11/23 0849 97.8 F (36.6 C)     Temp Source 03/11/23 0849 Oral     SpO2 03/11/23 0849 98 %     Weight 03/11/23 0847 295 lb 3.1 oz (133.9 kg)     Height 03/11/23 0847 5\' 11"  (1.803 m)     Head Circumference --      Peak Flow --      Pain Score 03/11/23 0847 5     Pain Loc --      Pain Education --      Exclude from Growth Chart --    No data found.  Updated Vital Signs BP 129/87 (BP Location: Right Arm)   Pulse 64   Temp 97.8 F (36.6  C) (Oral)   Resp 16   Ht 5\' 11"  (1.803 m)   Wt 133.9 kg   SpO2 98%   BMI 41.17 kg/m   Visual Acuity Right Eye Distance:   Left Eye Distance:   Bilateral Distance:    Right Eye Near:   Left Eye Near:    Bilateral Near:     Physical Exam GEN: well appearing male in no acute distress  CVS: well perfused  RESP: speaking in full sentences without pause, no respiratory distress  GU:  normal penile shaft, no urethral discharge, non tender testicles, chaperone by CMA declined, pt's finance present in exam room for exam  UC Treatments / Results  Labs (all labs ordered are listed, but only abnormal results are displayed) Labs Reviewed  URINALYSIS, W/ REFLEX TO CULTURE (INFECTION SUSPECTED)  CYTOLOGY, (ORAL, ANAL, URETHRAL) ANCILLARY ONLY    EKG   Radiology US SCROTUM W/DOPPLER Result Date: 03/11/2023 CLINICAL DATA:  RIGHT testicular pain. EXAM: SCROTAL ULTRASOUND DOPPLER ULTRASOUND OF THE TESTICLES TECHNIQUE: Complete ultrasound examination of the testicles,  epididymis, and other scrotal structures was performed. Color and spectral Doppler ultrasound were also utilized to evaluate blood flow to the testicles. COMPARISON:  None Available. FINDINGS: Right testicle Measurements: Normal in size and homogeneous in echotexture measuring 4.6 x 2.4 x 3.2 cm. No mass or microlithiasis visualized. Left testicle Measurements: Normal size and homogeneous in echotexture measuring 4.8 x 2.4 x 3.3 cm. No mass or microlithiasis visualized. Right epididymis:  Normal in size and appearance. Left epididymis:  Normal in size and appearance. Hydrocele:  Small bilateral hydroceles Varicocele:  None visualized. Pulsed Doppler interrogation of both testes demonstrates normal low resistance arterial and venous waveforms bilaterally. IMPRESSION: 1. Normal testicles. Normal vascular flow . No evidence of torsion. 2. Small bilateral hydroceles. Electronically Signed   By: Genevive Bi M.D.   On: 03/11/2023 11:17     Procedures Procedures (including critical care time)  Medications Ordered in UC Medications - No data to display  Initial Impression / Assessment and Plan / UC Course  I have reviewed the triage vital signs and the nursing notes.  Pertinent labs & imaging results that were available during my care of the patient were reviewed by me and considered in my medical decision making (see chart for details).     Pt is a 28 y.o. year old male who presents for testicular pain that started  4 days ago.  Overall, patient is well-appearing and in no acute distress.  Differential diagnosis includes testicular torsion, testicular mass/cancer, epididymitis, STI, varicocele, hydrocele, scrotal trauma, viral orchitis, inguinal hernia  UA not concerning for acute cystitis.  No hematuria seen. On exam, he has right testicular tenderness near the epididymis which feels edematous. GC and chlamydia collected.  Given unilateral pain, ordered STAT scrotal [place] with Doppler. Pt to travel to  [facility] for his ultrasound.  Treat presumed epididymitis with doxycycline.    Return precautions including abdominal pain, fever, chills, nausea, or vomiting given. ED precautions given and understanding voiced. He may need to see a urologist.    Radiologist impression reviewed and notes small bilateral hydroceles. Pt called and updated with results. See telephone note for details.  Final Clinical Impressions(s) / UC Diagnoses   Final diagnoses:  Testicular pain, right     Discharge Instructions      Go to the medical mall to have an ultrasound performed. Look for signs at [facility] for the [facility]. Enter through  the medical mall and go to the radiology department for your ultrasound.    Your urine sample did not show any blood or infection.  Your STD test results will be available in the next 36-72 hours. If positive, someone will contact you.  You should see your results in your MyChart account.       ED Prescriptions   None    PDMP not reviewed this encounter.   Katha Cabal, DO 03/13/23 1507

## 2023-03-11 NOTE — Telephone Encounter (Signed)
 Called patient about his scrotal ultrasound results.  All questions asked were answered.  Treat for possible epididymitis with doxycyline as prescribed.   Katha Cabal, DO

## 2023-03-13 LAB — CYTOLOGY, (ORAL, ANAL, URETHRAL) ANCILLARY ONLY
Chlamydia: NEGATIVE
Comment: NEGATIVE
Comment: NEGATIVE
Comment: NORMAL
Neisseria Gonorrhea: NEGATIVE
Trichomonas: NEGATIVE

## 2023-03-18 DIAGNOSIS — Z419 Encounter for procedure for purposes other than remedying health state, unspecified: Secondary | ICD-10-CM | POA: Diagnosis not present

## 2023-04-29 DIAGNOSIS — Z419 Encounter for procedure for purposes other than remedying health state, unspecified: Secondary | ICD-10-CM | POA: Diagnosis not present

## 2023-05-29 DIAGNOSIS — Z419 Encounter for procedure for purposes other than remedying health state, unspecified: Secondary | ICD-10-CM | POA: Diagnosis not present

## 2023-06-29 DIAGNOSIS — Z419 Encounter for procedure for purposes other than remedying health state, unspecified: Secondary | ICD-10-CM | POA: Diagnosis not present

## 2023-07-29 DIAGNOSIS — Z419 Encounter for procedure for purposes other than remedying health state, unspecified: Secondary | ICD-10-CM | POA: Diagnosis not present

## 2023-08-29 DIAGNOSIS — Z419 Encounter for procedure for purposes other than remedying health state, unspecified: Secondary | ICD-10-CM | POA: Diagnosis not present

## 2023-09-29 DIAGNOSIS — Z419 Encounter for procedure for purposes other than remedying health state, unspecified: Secondary | ICD-10-CM | POA: Diagnosis not present

## 2023-11-17 DIAGNOSIS — M25561 Pain in right knee: Secondary | ICD-10-CM | POA: Diagnosis not present

## 2023-11-29 DIAGNOSIS — Z419 Encounter for procedure for purposes other than remedying health state, unspecified: Secondary | ICD-10-CM | POA: Diagnosis not present

## 2023-12-19 DIAGNOSIS — M25561 Pain in right knee: Secondary | ICD-10-CM | POA: Diagnosis not present

## 2023-12-21 DIAGNOSIS — S8391XA Sprain of unspecified site of right knee, initial encounter: Secondary | ICD-10-CM | POA: Diagnosis not present

## 2023-12-21 DIAGNOSIS — S83281A Other tear of lateral meniscus, current injury, right knee, initial encounter: Secondary | ICD-10-CM | POA: Diagnosis not present

## 2024-01-01 DIAGNOSIS — M25561 Pain in right knee: Secondary | ICD-10-CM | POA: Diagnosis not present

## 2024-01-01 DIAGNOSIS — M25661 Stiffness of right knee, not elsewhere classified: Secondary | ICD-10-CM | POA: Diagnosis not present
